# Patient Record
Sex: Female | Born: 1970 | Race: White | Hispanic: No | Marital: Married | Smoking: Never smoker
Health system: Southern US, Community
[De-identification: ages and names within clinical notes are randomized; demographics above are authoritative.]

## PROBLEM LIST (undated history)

## (undated) DIAGNOSIS — K219 Gastro-esophageal reflux disease without esophagitis: Secondary | ICD-10-CM

## (undated) DIAGNOSIS — E785 Hyperlipidemia, unspecified: Secondary | ICD-10-CM

## (undated) DIAGNOSIS — E559 Vitamin D deficiency, unspecified: Secondary | ICD-10-CM

## (undated) DIAGNOSIS — K222 Esophageal obstruction: Secondary | ICD-10-CM

## (undated) DIAGNOSIS — Z9889 Other specified postprocedural states: Secondary | ICD-10-CM

## (undated) DIAGNOSIS — R112 Nausea with vomiting, unspecified: Secondary | ICD-10-CM

## (undated) DIAGNOSIS — K449 Diaphragmatic hernia without obstruction or gangrene: Secondary | ICD-10-CM

## (undated) DIAGNOSIS — M1990 Unspecified osteoarthritis, unspecified site: Secondary | ICD-10-CM

## (undated) HISTORY — DX: Diaphragmatic hernia without obstruction or gangrene: K44.9

## (undated) HISTORY — PX: APPENDECTOMY: SHX54

## (undated) HISTORY — DX: Vitamin D deficiency, unspecified: E55.9

## (undated) HISTORY — PX: ABDOMINAL HYSTERECTOMY: SHX81

## (undated) HISTORY — PX: EXPLORATORY LAPAROTOMY: SUR591

## (undated) HISTORY — PX: CHOLECYSTECTOMY: SHX55

## (undated) HISTORY — DX: Unspecified osteoarthritis, unspecified site: M19.90

## (undated) HISTORY — DX: Hyperlipidemia, unspecified: E78.5

## (undated) HISTORY — DX: Esophageal obstruction: K22.2

## (undated) HISTORY — PX: COLONOSCOPY: SHX174

---

## 1998-02-13 ENCOUNTER — Observation Stay (HOSPITAL_COMMUNITY): Admission: AD | Admit: 1998-02-13 | Discharge: 1998-02-14 | Payer: Self-pay | Admitting: Obstetrics and Gynecology

## 1998-03-05 ENCOUNTER — Observation Stay (HOSPITAL_COMMUNITY): Admission: AD | Admit: 1998-03-05 | Discharge: 1998-03-06 | Payer: Self-pay | Admitting: Obstetrics and Gynecology

## 1998-03-20 ENCOUNTER — Other Ambulatory Visit: Admission: RE | Admit: 1998-03-20 | Discharge: 1998-03-20 | Payer: Self-pay | Admitting: Obstetrics and Gynecology

## 1998-05-08 ENCOUNTER — Encounter: Payer: Self-pay | Admitting: Obstetrics and Gynecology

## 1998-05-08 ENCOUNTER — Ambulatory Visit (HOSPITAL_COMMUNITY): Admission: RE | Admit: 1998-05-08 | Discharge: 1998-05-08 | Payer: Self-pay | Admitting: Obstetrics and Gynecology

## 1998-06-10 ENCOUNTER — Inpatient Hospital Stay (HOSPITAL_COMMUNITY): Admission: AD | Admit: 1998-06-10 | Discharge: 1998-06-10 | Payer: Self-pay | Admitting: Obstetrics and Gynecology

## 1998-06-14 ENCOUNTER — Inpatient Hospital Stay (HOSPITAL_COMMUNITY): Admission: AD | Admit: 1998-06-14 | Discharge: 1998-06-14 | Payer: Self-pay | Admitting: Obstetrics and Gynecology

## 1998-07-09 ENCOUNTER — Ambulatory Visit (HOSPITAL_COMMUNITY): Admission: RE | Admit: 1998-07-09 | Discharge: 1998-07-09 | Payer: Self-pay | Admitting: Obstetrics and Gynecology

## 1998-09-25 ENCOUNTER — Inpatient Hospital Stay (HOSPITAL_COMMUNITY): Admission: AD | Admit: 1998-09-25 | Discharge: 1998-09-29 | Payer: Self-pay | Admitting: Obstetrics and Gynecology

## 1998-09-25 ENCOUNTER — Encounter (INDEPENDENT_AMBULATORY_CARE_PROVIDER_SITE_OTHER): Payer: Self-pay | Admitting: Specialist

## 2000-12-07 ENCOUNTER — Other Ambulatory Visit: Admission: RE | Admit: 2000-12-07 | Discharge: 2000-12-07 | Payer: Self-pay | Admitting: Obstetrics and Gynecology

## 2001-08-12 ENCOUNTER — Ambulatory Visit (HOSPITAL_COMMUNITY): Admission: RE | Admit: 2001-08-12 | Discharge: 2001-08-12 | Payer: Self-pay | Admitting: Obstetrics and Gynecology

## 2001-08-12 ENCOUNTER — Encounter: Payer: Self-pay | Admitting: Obstetrics and Gynecology

## 2001-09-15 ENCOUNTER — Emergency Department (HOSPITAL_COMMUNITY): Admission: EM | Admit: 2001-09-15 | Discharge: 2001-09-16 | Payer: Self-pay | Admitting: Emergency Medicine

## 2001-09-16 ENCOUNTER — Encounter: Payer: Self-pay | Admitting: Emergency Medicine

## 2002-02-10 ENCOUNTER — Other Ambulatory Visit: Admission: RE | Admit: 2002-02-10 | Discharge: 2002-02-10 | Payer: Self-pay | Admitting: Obstetrics and Gynecology

## 2002-03-07 ENCOUNTER — Inpatient Hospital Stay (HOSPITAL_COMMUNITY): Admission: RE | Admit: 2002-03-07 | Discharge: 2002-03-10 | Payer: Self-pay | Admitting: Obstetrics and Gynecology

## 2008-03-28 ENCOUNTER — Ambulatory Visit (HOSPITAL_COMMUNITY): Admission: RE | Admit: 2008-03-28 | Discharge: 2008-03-29 | Payer: Self-pay | Admitting: Obstetrics and Gynecology

## 2010-08-13 NOTE — Op Note (Signed)
[name], [name]                 ACCOUNT NO.:  [id number]   MEDICAL RECORD NO.:  [id number]          PATIENT TYPE:  AMB   LOCATION:  SDC                           FACILITY:  [facility]   PHYSICIAN:  Zenaida Niece, M.D.DATE OF BIRTH:  [birth year]   DATE OF PROCEDURE:  03/28/2008  DATE OF DISCHARGE:                               OPERATIVE REPORT   PREOPERATIVE DIAGNOSES:  Pelvic pain.   POSTOPERATIVE DIAGNOSES:  Pelvic pain and extensive pelvic adhesions.   PROCEDURE:  Laparoscopy with extensive adhesiolysis and cystoscopy.   ANESTHESIA:  General endotracheal.   FINDINGS:  The omentum was significantly adherent to the anterior  abdominal wall and the entire pelvis.   SPECIMENS:  None.   ESTIMATED BLOOD LOSS:  Minimal.   COMPLICATIONS:  None.   PROCEDURE IN DETAIL:  The patient was taken to the operating room and  placed in the dorsal supine position.  General anesthesia was induced  and she was placed in mobile stirrups.  Abdomen was then prepped and  draped in usual sterile fashion, bladder drained with a latex-free  catheter and a sponge stick placed at the vaginal cuff for manipulation.  Infraumbilical skin was then infiltrated with 0.25% Marcaine and a 0.75  cm of vertical incision was made.  The Veress needle was inserted into  the peritoneal cavity and placement confirmed by the water-drop test and  an opening pressure of 4 mmHg.  CO2 gas was insufflated to a pressure of  14 mmHg and the Veress needle was removed.  A 5 mm disposable trocar was  then introduced with direct visualization.  Once the laparoscope was  inserted, it was noted that there were omental adhesions in the midline  from the umbilicus down.  The umbilical trocar appeared to be through  some of these adhesions.  A 5 mm port was placed on the left side under  direct visualization.  I initially inspected the pelvis.  Both tubes and  ovaries were inspected on either side of this line of adhesions.  They  were slightly adherent.  I used the Harmonic scalpel ACE and started  taking down some of these adhesions from the left side.  They were  fairly thick.  I then put the scope through the left port to see if I  could take down the adhesions through the umbilicus.  This is when I  noticed that this trocar was through some adhesions.  A second 5 mm port  was placed on the left side and adhesions were taken down around the  umbilical trocar with the Harmonic scalpel ACE.  This was done very  carefully to avoid bowel which was also adherent to the anterior  abdominal wall.  Gradually, all the adhesions were taken down to the  lower pelvis.  At this point, it was difficult to tell where the bladder  was.  Tubes and ovaries were freed up as best I could.  Again, I was  unable to clearly visualize the bladder.   She was given indigo carmine IV.  Legs were elevated in stirrups.  A 70  degree cystoscope was inserted through the urethra into the bladder and  200 mL of sterile solution was instilled.  The bladder appeared normal  and both the ureteral orifices were easily identified and had good  efflux of blue-tinged urine.  The cystoscope was then removed.  I then  changed gloves.  Attention was turned back to the laparoscopy.  The  pelvis was irrigated and found to be hemostatic.  At this point, I did  not feel I could take down any further adhesions without potentially  injuring the bowel or the bladder.  I carefully inspected any bowel that  was taken down and no serosal injury was identified.  All sites were  adequately hemostatic.  All ports were removed under direct  visualization.  Gas was allowed to deflate from the abdomen.  Skin  incisions were closed with interrupted subcuticular sutures of 4-0  Vicryl followed by Dermabond.  The sponge stick was removed from the  vagina and the bladder was drained with a red Robinson catheter.  The  patient was awakened in the operating room and taken to  the recovery  room in stable condition after tolerating the procedure well.  Counts  were correct and she had PAS hose on  throughout the procedure.      Zenaida Niece, M.D.  Electronically Signed     TDM/MEDQ  D:  03/28/2008  T:  03/29/2008  Job:  [id number]

## 2010-08-16 NOTE — Discharge Summary (Signed)
Emily Johns, Emily Johns                           ACCOUNT NO.:  [id number]   MEDICAL RECORD NO.:  [id number]                   PATIENT TYPE:  INP   LOCATION:  9324                                 FACILITY:  [facility]   PHYSICIAN:  Zenaida Niece, M.D.             DATE OF BIRTH:  1971-03-11   DATE OF ADMISSION:  03/07/2002  DATE OF DISCHARGE:  03/10/2002                                 DISCHARGE SUMMARY   ADMISSION DIAGNOSES:  1. Irregular menses.  2. Dysmenorrhea.  3. Dyspareunia.   DISCHARGE DIAGNOSES:  1. Irregular menses.  2. Dysmenorrhea.  3. Dyspareunia.  4. Adenomyosis.   PROCEDURE:  Total abdominal hysterectomy.   HISTORY AND PHYSICAL:  Please see the chart for the full history and  physical.  Briefly, this is a 40 year old white female para 3-0-0-3 with  recently heavier menses with significant cramping and significant mood  swings despite birth control pills.  She also has occasional deep  dyspareunia.  She is admitted for definitive surgical therapy.   PAST OB HISTORY:  Three cesarean sections at term without complications.  She did have a tubal ligation with her last cesarean section.   PAST MEDICAL HISTORY:  Mild asthma and migraine headaches.   PHYSICAL EXAMINATION:  ABDOMEN:  Slightly obese without palpable masses with  mild bilateral lower quadrant tenderness and well healed transverse scar.  PELVIC:  She has equivocal cervical motion tenderness and a small mid plane  to anteverted uterus which is slightly tender and no adnexal masses,  although she is slightly tender on the right.   HOSPITAL COURSE:  The patient was admitted on the day of surgery and  underwent a TAH under general anesthesia.  Estimated blood loss was 100 cc.  She had normal tubes, ovaries, and appendix with evidence of prior tubal  ligation.  Uterus was slightly enlarged and slightly boggy with some  adhesions in the anterior cul-de-sac from her prior cesarean sections.  Postoperatively she  did fairly well, although she did seem to have  suboptimal pain control.  Preoperative hemoglobin 12.9, postoperative 11.0.  She did have a low grade fever on the evening of postoperative day number  one to 100.8 which was attributed to atelectasis.  With encouragement she  was able to ambulate and eventually tolerate a regular diet.  She did have  nausea and vomiting on the evening of surgery.  On postoperative day number  three her incision was healing well and her staples were removed and Steri-  Strips applied.  She was felt to be stable enough for discharge at this  time.    DISCHARGE INSTRUCTIONS:  Regular diet.  Pelvic rest and no strenuous  activity.  Follow up in approximately 10 days for an incision check.  Medications are Percocet p.r.n. pain and over-the-counter Motrin as needed.  Zenaida Niece, M.D.    TDM/MEDQ  D:  03/10/2002  T:  03/10/2002  Job:  [id number]

## 2010-08-16 NOTE — Op Note (Signed)
NAME:  Emily Johns, Emily Johns                           ACCOUNT NO.:  [id number]   MEDICAL RECORD NO.:  [id number]                   PATIENT TYPE:  INP   LOCATION:  9324                                 FACILITY:  [facility]   PHYSICIAN:  Zenaida Niece, M.D.             DATE OF BIRTH:  1970/05/10   DATE OF PROCEDURE:  03/07/2002  DATE OF DISCHARGE:                                 OPERATIVE REPORT   PREOPERATIVE DIAGNOSES:  1. Irregular menses.  2. Dysmenorrhea.  3. Dyspareunia.   POSTOPERATIVE DIAGNOSES:  1. Irregular menses.  2. Dysmenorrhea.  3. Dyspareunia.   PROCEDURE:  Total abdominal hysterectomy.   SURGEON:  Zenaida Niece, M.D.   ASSISTANT:  Huel Cote, M.D.   ANESTHESIA:  General endotracheal tube.   ESTIMATED BLOOD LOSS:  100 cc.   FINDINGS:  Normal tubes, ovaries, and appendix.  Uterus was slightly  enlarged and slightly boggy with some adhesions in the anterior cul-de-sac  from prior cesarean sections.  There was also evidence of prior tubal  ligation.   PROCEDURE IN DETAIL:  The patient was taken to the operating room and placed  in the dorsal supine position.  General anesthesia was induced and her  abdomen and vagina were prepped and draped in the usual sterile fashion and  a Foley catheter inserted.  Her abdomen was then entered via her previous  Pfannenstiel incision.  A self retaining retractor was placed and the bowel  was packed out of the pelvis.  Both uterine cornu were grasped with long  Kelly clamps.  Both round ligaments were taken down with electrocautery and  the anterior broad ligament taken down across the anterior portion of the  uterus to create the bladder flap.  A window was made in an avascular  portion of the posterior leaf of the broad ligament and both utero-ovarian  pedicles were clamped, transected, and doubly ligated with 0 Vicryl and  number one chromic.  The pedicles were hemostatic.  Uterine arteries were  skeletonized and the  bladder was pushed inferiorly sharply and bluntly.  Uterine arteries, cardinal ligaments, and uterosacral ligaments were  clamped, transected, and ligated on each side with number 1 chromic.  Uterosacral ligaments were tagged for later use.  The vaginal angles were  then clamped with Zeppelin clamps, transected, and the uterus and cervix  were removed.  Both uterine angles were sutured with number 1 chromic and  tagged for later use.  The remainder of the vagina was closed with  interrupted figure-of-eight sutures of number 1 chromic with adequate  hemostasis.  Bleeding from the lower portion of the vagina between the cuff  and the bladder was controlled with electrocautery.  The pelvis was  irrigated and found to be hemostatic.  Uterosacral ligaments were plicated  in the midline with 2-0 silk.  The previously tagged uterosacral pedicles  were also tied in the midline.  The  pelvis was again irrigated and small  bleeders from around the bladder edge were controlled with electrocautery.  Both ureters were identified and found to be out of the operative field.  The bowel packs were removed and the self retaining retractor was removed.  The subfascial space was irrigated and made hemostatic with electrocautery.  The fascia was closed in a running fashion starting at both ends and meeting  in the middle with 0 Vicryl.  The subcutaneous tissue was irrigated and made  hemostatic with electrocautery.  Subcutaneous tissue was then closed with  running 2-0 plain gut suture and skin was closed with staples and a sterile  dressing.  The patient tolerated the procedure well and was taken to the  recovery room in stable condition.  Counts were correct x2 and she was given  Ancef 1 g prior to the procedure.                                               Zenaida Niece, M.D.    TDM/MEDQ  D:  03/07/2002  T:  03/07/2002  Job:  [id number]

## 2010-08-16 NOTE — H&P (Signed)
NAME:  Emily Johns, Emily Johns                           ACCOUNT NO.:  [id number]   MEDICAL RECORD NO.:  [id number]                   PATIENT TYPE:  INP   LOCATION:  NA                                   FACILITY:  [facility]   PHYSICIAN:  Zenaida Niece, M.D.             DATE OF BIRTH:  22-Sep-1970   DATE OF ADMISSION:  03/07/2002  DATE OF DISCHARGE:                                HISTORY & PHYSICAL   CHIEF COMPLAINT:  Abnormal uterine bleeding and dysmenorrhea.   HISTORY OF PRESENT ILLNESS:  The patient is a 40 year old white female para  3-0-0-3 whom I last saw for an annual exam on February 10, 2002.  She had  been on birth control pills since a previous telephone conversation in  September at which point she complained of menses becoming heavier with  significant cramping and significant mood swings.  She was on Ortho Tri-  Cyclen Lo and noted no improvement in her bleeding or cramping.  She also  complains of occasional deep dyspareunia.  Review of systems was otherwise  negative.  Physical exam revealed some bilateral lower quadrant tenderness  and a slightly tender uterus and slightly tender right adnexa.  Options were  discussed with the patient and she is done with childbearing and wishes to  proceed with definitive surgical therapy.   PAST OBSTETRICAL HISTORY:  Three cesarean sections at term without  complications.  With her last cesarean section in June 2000, she had a tubal  ligation as well.   PAST MEDICAL HISTORY:  History of mild asthma and a history of migraine  headaches.   PAST SURGICAL HISTORY:  Three cesarean sections with tubal ligation, removal  of a pilonidal cyst, cholecystectomy, wisdom tooth extraction, and urethral  dilation as a child.   ALLERGIES:  None known.   CURRENT MEDICATION:  Ortho Tri-Cyclen Lo.   SOCIAL HISTORY:  The patient is married and denies alcohol, tobacco, or drug  use.   FAMILY HISTORY:  No GYN or colon cancer.   REVIEW OF SYSTEMS:   Normal bowel and bladder function.   PHYSICAL EXAMINATION:  VITAL SIGNS:  Weight is 220 pounds, blood pressure is  130/80, pulse is 80.  GENERAL:  The patient is a slightly obese white female who is in no acute  distress.  NECK:  Supple without lymphadenopathy or thyromegaly.  LUNGS:  Clear to auscultation.  HEART:  Regular rate and rhythm without murmurs.  BREAST:  Breast exam in the sitting and supine position reveals no dominant  masses, adenopathy, skin change, or nipple discharge.  ABDOMEN:  Slightly obese without palpable masses and she does have mild  bilateral lower quadrant tenderness with a well-healed transverse scar.  EXTREMITIES:  No edema, are nontender, and DTRs are 2/4 and symmetric.  PELVIC:  External genitalia reveals no lesions.  On speculum exam she has a  normal-appearing cervix, no lesions, and Pap smear  is normal.  On bimanual  exam she has equivocal cervical motion tenderness and a small mid-plantar to  anteverted uterus which is slightly tender.  She has no adnexal masses but  is slightly tender on the right.   ASSESSMENT:  Persistent irregular menses with significant dysmenorrhea and  dyspareunia.  All medical and surgical options have been discussed with the  patient.  She is done with childbearing as witnessed by her previous tubal  ligation.  The patient wishes to undergo definitive surgical therapy with  hysterectomy.  Risks of surgery including bleeding, infection, damage to  surrounding organs have been discussed with the patient as well as permanent  sterility and she wishes to proceed.   PLAN:  Plan is to admit the patient on the day of surgery for a total  abdominal hysterectomy.  We will leave her ovaries unless they appear  abnormal.                                               Zenaida Niece, M.D.    TDM/MEDQ  D:  03/04/2002  T:  03/04/2002  Job:  [id number]

## 2010-11-13 ENCOUNTER — Ambulatory Visit (HOSPITAL_COMMUNITY)
Admission: EM | Admit: 2010-11-13 | Discharge: 2010-11-15 | Disposition: A | Discharge status: Home / Self Care | Payer: Federal, State, Local not specified - PPO | Source: Ambulatory Visit | Attending: Surgery | Admitting: Surgery

## 2010-11-13 ENCOUNTER — Emergency Department (HOSPITAL_COMMUNITY): Payer: Federal, State, Local not specified - PPO

## 2010-11-13 DIAGNOSIS — J45909 Unspecified asthma, uncomplicated: Secondary | ICD-10-CM | POA: Insufficient documentation

## 2010-11-13 DIAGNOSIS — K3580 Unspecified acute appendicitis: Secondary | ICD-10-CM | POA: Insufficient documentation

## 2010-11-13 LAB — BASIC METABOLIC PANEL
Calcium: 8.9 mg/dL (ref 8.4–10.5)
Chloride: 104 mEq/L (ref 96–112)
Creatinine, Ser: 0.49 mg/dL — ABNORMAL LOW (ref 0.50–1.10)
GFR calc Af Amer: >60 mL/min (ref >60)
GFR calc non Af Amer: >60 mL/min (ref >60)

## 2010-11-13 LAB — CBC
MCH: 30.3 pg (ref 26.0–34.0)
MCHC: 34.1 g/dL (ref 30.0–36.0)
MCV: 88.9 fL (ref 78.0–100.0)
Platelets: 243 10*3/uL (ref 150–400)
RDW: 12.2 % (ref 11.5–15.5)
WBC: 6.1 10*3/uL (ref 4.0–10.5)

## 2010-11-13 LAB — DIFFERENTIAL
Eosinophils Absolute: 0.3 10*3/uL (ref 0.0–0.7)
Eosinophils Relative: 4 % (ref 0–5)
Lymphs Abs: 2.2 10*3/uL (ref 0.7–4.0)
Monocytes Absolute: 0.6 10*3/uL (ref 0.1–1.0)

## 2010-11-13 LAB — URINALYSIS, ROUTINE W REFLEX MICROSCOPIC
Bilirubin Urine: NEGATIVE
Glucose, UA: NEGATIVE mg/dL
Hgb urine dipstick: NEGATIVE
Ketones, ur: NEGATIVE mg/dL
Nitrite: NEGATIVE
Protein, ur: NEGATIVE mg/dL
Specific Gravity, Urine: 1.013 (ref 1.005–1.030)
Urobilinogen, UA: 1 mg/dL (ref 0.0–1.0)
pH: 6.5 (ref 5.0–8.0)

## 2010-11-13 LAB — URINE MICROSCOPIC-ADD ON

## 2010-11-14 ENCOUNTER — Other Ambulatory Visit (INDEPENDENT_AMBULATORY_CARE_PROVIDER_SITE_OTHER): Payer: Self-pay | Admitting: General Surgery

## 2010-11-14 DIAGNOSIS — K3580 Unspecified acute appendicitis: Secondary | ICD-10-CM

## 2010-11-14 MED ORDER — IOHEXOL 300 MG/ML  SOLN
100.0000 mL | Freq: Once | INTRAMUSCULAR | Status: AC | PRN
  Administered 2010-11-14: 100 mL via INTRAVENOUS

## 2010-11-19 NOTE — Op Note (Signed)
Emily Johns, NEMMERS NO.:  [id number]  MEDICAL RECORD NO.:  [id number]  LOCATION:                                 FACILITY:  PHYSICIAN:  Cherylynn Ridges, M.D.    DATE OF BIRTH:  [birth year]  DATE OF PROCEDURE:  11/14/2010 DATE OF DISCHARGE:                              OPERATIVE REPORT   PREOPERATIVE DIAGNOSIS:  Acute appendicitis.  POSTOPERATIVE DIAGNOSIS:  Thickened and large appendix with possible early acute appendicitis.  PROCEDURE:  Laparoscopic appendectomy.  SURGEON:  Cherylynn Ridges, MD  ANESTHESIA:  General endotracheal.  ESTIMATED BLOOD LOSS:  Less than 50 mL.  COMPLICATIONS:  None.  CONDITION:  Stable.  FINDINGS:  Adhesions into the pelvic cavity, a fat appendix with possible early acute appendicitis.  No perforation or abscess.  INDICATIONS FOR OPERATION:  The patient is a 40 year old with severe right lower quadrant pain and tenderness, a fat thickened appendix on CT scan, a normal white count and diagnostic of acute appendicitis by CT findings.  OPERATION:  After proper time-out was performed identifying the patient and procedure will be done.  She was intubated and sedated in usual sterile manner and prepped and draped in usual sterile manner.  An infraumbilical midline incision was made using #15 blade, taken down to the midline fascia.  We grabbed the fascial edges with Kocher clamps and incised between that with a 15 blade.  We bluntly dissected down into the peritoneal cavity.  Once we did so, a pursestring suture of 0 Vicryl was passed around the fascial opening, then we passed a Hasson cannula into the peritoneal cavity securing it in place with a pursestring suture.  We then passed right upper quadrant 5-mm cannula and a left lower quadrant 12-mm cannula under direct vision.  The most difficult to pass was the left lower quadrant one as there were some adhesions of bowel and other structures to the anterior abdominal  wall. A care was taken to manipulate the cannula between the spaces of the peritoneal cavity and no injury to bowel was noted.  We were able to observe the enlarging fat and appendix in the left paracolic area.  It was not retrocecal.  We dissected out at its base using a [place].  We came across the base using a 3.5-mm blue cartridge Endo-GIA and then two cartridges of the 2.5-mm white vascular Endo-GIA was passed across the mesoappendix.  There was minimal bleeding.  We retrieved the appendix from the left lower quadrant site using an EndoCatch bag.  We irrigated with saline solution, aspirated all fluid and gas from above the liver and around the liver, then removed all cannulas.  We inspected the left lower quadrant site to make sure there was no injury to bowel again and none was noted.  We tied off the fascia at the infraumbilical site using a pursestring suture which was in place.  The right upper quadrant cannula was removed.  We injected 0.25% Marcaine with epi at all sites.  We then closed the left lower quadrant and the infraumbilical skin sites using running subcuticular stitch of 4-0 Monocryl.  Dermabond, Steri-Strips, and Tegaderm were used to  complete all other dressings.  All needle counts, sponge counts, and instrument counts were correct.     Cherylynn Ridges, M.D.     JOW/MEDQ  D:  11/14/2010  T:  11/14/2010  Job:  [id number]  Electronically Signed by Jimmye Norman M.D. on 11/19/2010 12:04:25 AM

## 2010-11-26 ENCOUNTER — Encounter (INDEPENDENT_AMBULATORY_CARE_PROVIDER_SITE_OTHER): Payer: Self-pay | Admitting: Radiology

## 2010-11-26 ENCOUNTER — Ambulatory Visit (INDEPENDENT_AMBULATORY_CARE_PROVIDER_SITE_OTHER): Payer: Federal, State, Local not specified - PPO | Admitting: Radiology

## 2010-11-26 DIAGNOSIS — K3580 Unspecified acute appendicitis: Secondary | ICD-10-CM

## 2010-11-26 NOTE — Progress Notes (Signed)
History of Present Illness: Emily Johns is a  40 y.o. female who presents today status post lap appy.  Pathology reveals appendicitis.  The patient is tolerating a regular diet, having normal bowel movements, has good pain control.  She  is back to most normal activities.   Physical Exam: Abd: soft, nontender, active bowel sounds, nondistended.  All incisions are well healed.  Impression: 1.  Acute appendicitis, s/p lap appy  Plan: She is able to return to normal activities. She may follow up on a prn basis.

## 2011-01-02 LAB — GLUCOSE, CAPILLARY: Glucose-Capillary: 86 mg/dL (ref 70–99)

## 2011-01-02 LAB — CBC
Platelets: 295 10*3/uL (ref 150–400)
RDW: 12.4 % (ref 11.5–15.5)
WBC: 6.4 10*3/uL (ref 4.0–10.5)

## 2012-04-28 ENCOUNTER — Encounter (HOSPITAL_COMMUNITY): Payer: Self-pay | Admitting: Pharmacist

## 2012-05-03 ENCOUNTER — Encounter (HOSPITAL_COMMUNITY)
Admission: RE | Admit: 2012-05-03 | Discharge: 2012-05-03 | Disposition: A | Discharge status: Home / Self Care | Payer: Federal, State, Local not specified - PPO | Source: Ambulatory Visit | Attending: Obstetrics and Gynecology | Admitting: Obstetrics and Gynecology

## 2012-05-03 ENCOUNTER — Encounter (HOSPITAL_COMMUNITY): Payer: Self-pay

## 2012-05-03 HISTORY — DX: Other specified postprocedural states: R11.2

## 2012-05-03 HISTORY — DX: Other specified postprocedural states: Z98.890

## 2012-05-03 LAB — CBC
MCH: 30.5 pg (ref 26.0–34.0)
MCHC: 32.9 g/dL (ref 30.0–36.0)
MCV: 92.6 fL (ref 78.0–100.0)
Platelets: 267 10*3/uL (ref 150–400)

## 2012-05-03 LAB — SURGICAL PCR SCREEN: MRSA, PCR: NEGATIVE

## 2012-05-03 NOTE — Patient Instructions (Addendum)
Your procedure is scheduled on:05/06/12  Enter through the Main Entrance at :6am Pick up desk phone and dial [phone] and inform us of your arrival.  Please call [phone] if you have any problems the morning of surgery.  Remember: Do not eat or drink after midnight:WED   Take these meds the morning of surgery with a sip of water:none  DO NOT wear jewelry, eye make-up, lipstick,body lotion, or dark fingernail polish. Do not shave for 48 hours prior to surgery.  If you are to be admitted after surgery, leave suitcase in car until your room has been assigned. Patients discharged on the day of surgery will not be allowed to drive home.

## 2012-05-06 ENCOUNTER — Ambulatory Visit (HOSPITAL_COMMUNITY)
Admission: RE | Admit: 2012-05-06 | Discharge: 2012-05-06 | Disposition: A | Discharge status: Home / Self Care | Payer: Federal, State, Local not specified - PPO | Source: Ambulatory Visit | Attending: Obstetrics and Gynecology | Admitting: Obstetrics and Gynecology

## 2012-05-06 ENCOUNTER — Encounter (HOSPITAL_COMMUNITY): Payer: Self-pay | Admitting: *Deleted

## 2012-05-06 ENCOUNTER — Ambulatory Visit (HOSPITAL_COMMUNITY): Payer: Federal, State, Local not specified - PPO | Admitting: Anesthesiology

## 2012-05-06 ENCOUNTER — Encounter (HOSPITAL_COMMUNITY): Payer: Self-pay | Admitting: Anesthesiology

## 2012-05-06 ENCOUNTER — Encounter (HOSPITAL_COMMUNITY)
Admission: RE | Disposition: A | Discharge status: Home / Self Care | Payer: Self-pay | Source: Ambulatory Visit | Attending: Obstetrics and Gynecology

## 2012-05-06 DIAGNOSIS — Z01818 Encounter for other preprocedural examination: Secondary | ICD-10-CM | POA: Insufficient documentation

## 2012-05-06 DIAGNOSIS — Z01812 Encounter for preprocedural laboratory examination: Secondary | ICD-10-CM | POA: Insufficient documentation

## 2012-05-06 DIAGNOSIS — N736 Female pelvic peritoneal adhesions (postinfective): Secondary | ICD-10-CM | POA: Insufficient documentation

## 2012-05-06 DIAGNOSIS — N949 Unspecified condition associated with female genital organs and menstrual cycle: Secondary | ICD-10-CM | POA: Insufficient documentation

## 2012-05-06 HISTORY — PX: LAPAROSCOPY: SHX197

## 2012-05-06 HISTORY — PX: SALPINGOOPHORECTOMY: SHX82

## 2012-05-06 HISTORY — PX: LAPAROSCOPIC LYSIS OF ADHESIONS: SHX5905

## 2012-05-06 SURGERY — LAPAROSCOPY OPERATIVE
Anesthesia: General | Site: Abdomen | Wound class: Clean Contaminated

## 2012-05-06 MED ORDER — MIDAZOLAM HCL 5 MG/5ML IJ SOLN
INTRAMUSCULAR | Status: DC | PRN
  Administered 2012-05-06: 2 mg via INTRAVENOUS

## 2012-05-06 MED ORDER — OXYCODONE-ACETAMINOPHEN 5-325 MG PO TABS: 1.0000 | ORAL_TABLET | ORAL | Status: DC | PRN

## 2012-05-06 MED ORDER — ONDANSETRON HCL 4 MG/2ML IJ SOLN
INTRAMUSCULAR | Status: AC
  Filled 2012-05-06: qty 2

## 2012-05-06 MED ORDER — DEXAMETHASONE SODIUM PHOSPHATE 10 MG/ML IJ SOLN
INTRAMUSCULAR | Status: AC
  Filled 2012-05-06: qty 1

## 2012-05-06 MED ORDER — DEXAMETHASONE SODIUM PHOSPHATE 4 MG/ML IJ SOLN
INTRAMUSCULAR | Status: DC | PRN
  Administered 2012-05-06: 4 mg via INTRAVENOUS
  Administered 2012-05-06: 10 mg via INTRAVENOUS

## 2012-05-06 MED ORDER — LIDOCAINE HCL (CARDIAC) 20 MG/ML IV SOLN
INTRAVENOUS | Status: AC
  Filled 2012-05-06: qty 5

## 2012-05-06 MED ORDER — LACTATED RINGERS IR SOLN
Status: DC | PRN
  Administered 2012-05-06: 3000 mL

## 2012-05-06 MED ORDER — BUPIVACAINE HCL (PF) 0.25 % IJ SOLN
INTRAMUSCULAR | Status: AC
  Filled 2012-05-06: qty 30

## 2012-05-06 MED ORDER — LACTATED RINGERS IV SOLN
INTRAVENOUS | Status: DC
  Administered 2012-05-06: 08:00:00 via INTRAVENOUS
  Administered 2012-05-06: 75 mL/h via INTRAVENOUS
  Administered 2012-05-06: 11:00:00 via INTRAVENOUS

## 2012-05-06 MED ORDER — LIDOCAINE HCL (CARDIAC) 20 MG/ML IV SOLN
INTRAVENOUS | Status: DC | PRN
  Administered 2012-05-06: 80 mg via INTRAVENOUS

## 2012-05-06 MED ORDER — FENTANYL CITRATE 0.05 MG/ML IJ SOLN
INTRAMUSCULAR | Status: AC
  Administered 2012-05-06: 50 ug via INTRAVENOUS
  Filled 2012-05-06: qty 2

## 2012-05-06 MED ORDER — PROPOFOL 10 MG/ML IV EMUL
INTRAVENOUS | Status: DC | PRN
  Administered 2012-05-06: 200 mg via INTRAVENOUS

## 2012-05-06 MED ORDER — PROMETHAZINE HCL 25 MG/ML IJ SOLN: 6.2500 mg | INTRAMUSCULAR | Status: DC | PRN

## 2012-05-06 MED ORDER — FENTANYL CITRATE 0.05 MG/ML IJ SOLN
INTRAMUSCULAR | Status: DC | PRN
  Administered 2012-05-06: 50 ug via INTRAVENOUS
  Administered 2012-05-06 (×2): 100 ug via INTRAVENOUS

## 2012-05-06 MED ORDER — SCOPOLAMINE 1 MG/3DAYS TD PT72
MEDICATED_PATCH | TRANSDERMAL | Status: AC
  Filled 2012-05-06: qty 1

## 2012-05-06 MED ORDER — PROPOFOL 10 MG/ML IV EMUL
INTRAVENOUS | Status: AC
  Filled 2012-05-06: qty 20

## 2012-05-06 MED ORDER — MEPERIDINE HCL 25 MG/ML IJ SOLN: 6.2500 mg | INTRAMUSCULAR | Status: DC | PRN

## 2012-05-06 MED ORDER — ACETAMINOPHEN 10 MG/ML IV SOLN
INTRAVENOUS | Status: DC | PRN
  Administered 2012-05-06: 1000 mg via INTRAVENOUS

## 2012-05-06 MED ORDER — METOCLOPRAMIDE HCL 5 MG/ML IJ SOLN
INTRAMUSCULAR | Status: AC
  Administered 2012-05-06: 10 mg via INTRAVENOUS
  Filled 2012-05-06: qty 2

## 2012-05-06 MED ORDER — MIDAZOLAM HCL 2 MG/2ML IJ SOLN: 0.5000 mg | Freq: Once | INTRAMUSCULAR | Status: DC | PRN

## 2012-05-06 MED ORDER — ROCURONIUM BROMIDE 100 MG/10ML IV SOLN
INTRAVENOUS | Status: DC | PRN
  Administered 2012-05-06: 5 mg via INTRAVENOUS
  Administered 2012-05-06: 40 mg via INTRAVENOUS

## 2012-05-06 MED ORDER — HYDROMORPHONE HCL PF 1 MG/ML IJ SOLN
INTRAMUSCULAR | Status: AC
  Filled 2012-05-06: qty 1

## 2012-05-06 MED ORDER — NEOSTIGMINE METHYLSULFATE 1 MG/ML IJ SOLN
INTRAMUSCULAR | Status: AC
  Filled 2012-05-06: qty 1

## 2012-05-06 MED ORDER — BUPIVACAINE HCL (PF) 0.25 % IJ SOLN
INTRAMUSCULAR | Status: DC | PRN
  Administered 2012-05-06: 14 mL

## 2012-05-06 MED ORDER — METOCLOPRAMIDE HCL 5 MG/ML IJ SOLN
10.0000 mg | Freq: Once | INTRAMUSCULAR | Status: AC
  Administered 2012-05-06: 10 mg via INTRAVENOUS

## 2012-05-06 MED ORDER — SCOPOLAMINE 1 MG/3DAYS TD PT72
1.0000 | MEDICATED_PATCH | TRANSDERMAL | Status: DC
  Administered 2012-05-06: 1.5 mg via TRANSDERMAL

## 2012-05-06 MED ORDER — KETOROLAC TROMETHAMINE 30 MG/ML IJ SOLN: 15.0000 mg | Freq: Once | INTRAMUSCULAR | Status: DC | PRN

## 2012-05-06 MED ORDER — NEOSTIGMINE METHYLSULFATE 1 MG/ML IJ SOLN
INTRAMUSCULAR | Status: DC | PRN
  Administered 2012-05-06: 3 mg via INTRAVENOUS

## 2012-05-06 MED ORDER — ACETAMINOPHEN 10 MG/ML IV SOLN
INTRAVENOUS | Status: AC
  Filled 2012-05-06: qty 100

## 2012-05-06 MED ORDER — GLYCOPYRROLATE 0.2 MG/ML IJ SOLN
INTRAMUSCULAR | Status: DC | PRN
  Administered 2012-05-06: 0.4 mg via INTRAVENOUS

## 2012-05-06 MED ORDER — KETOROLAC TROMETHAMINE 30 MG/ML IJ SOLN
INTRAMUSCULAR | Status: DC | PRN
  Administered 2012-05-06: 30 mg via INTRAVENOUS

## 2012-05-06 MED ORDER — GLYCOPYRROLATE 0.2 MG/ML IJ SOLN
INTRAMUSCULAR | Status: AC
  Filled 2012-05-06: qty 2

## 2012-05-06 MED ORDER — HYDROMORPHONE HCL PF 1 MG/ML IJ SOLN
INTRAMUSCULAR | Status: DC | PRN
  Administered 2012-05-06: 1 mg via INTRAVENOUS

## 2012-05-06 MED ORDER — ROCURONIUM BROMIDE 50 MG/5ML IV SOLN
INTRAVENOUS | Status: AC
  Filled 2012-05-06: qty 1

## 2012-05-06 MED ORDER — FENTANYL CITRATE 0.05 MG/ML IJ SOLN
25.0000 ug | INTRAMUSCULAR | Status: DC | PRN
  Administered 2012-05-06 (×2): 50 ug via INTRAVENOUS

## 2012-05-06 MED ORDER — FENTANYL CITRATE 0.05 MG/ML IJ SOLN
INTRAMUSCULAR | Status: AC
  Filled 2012-05-06: qty 5

## 2012-05-06 MED ORDER — MIDAZOLAM HCL 2 MG/2ML IJ SOLN
INTRAMUSCULAR | Status: AC
  Filled 2012-05-06: qty 2

## 2012-05-06 MED ORDER — LACTATED RINGERS IV SOLN
INTRAVENOUS | Status: DC
Start: 2012-05-06 — End: 2012-05-06

## 2012-05-06 SURGICAL SUPPLY — 33 items
CABLE HIGH FREQUENCY MONO STRZ (ELECTRODE) IMPLANT
CATH ROBINSON RED A/P 16FR (CATHETERS) ×3 IMPLANT
CHLORAPREP W/TINT 26ML (MISCELLANEOUS) ×3 IMPLANT
CLOTH BEACON ORANGE TIMEOUT ST (SAFETY) ×3 IMPLANT
DECANTER SPIKE VIAL GLASS SM (MISCELLANEOUS) ×3 IMPLANT
DERMABOND ADVANCED (GAUZE/BANDAGES/DRESSINGS) ×1
DERMABOND ADVANCED .7 DNX12 (GAUZE/BANDAGES/DRESSINGS) ×2 IMPLANT
GLOVE BIO SURGEON STRL SZ8 (GLOVE) ×3 IMPLANT
GLOVE BIOGEL PI IND STRL 6.5 (GLOVE) ×4 IMPLANT
GLOVE BIOGEL PI INDICATOR 6.5 (GLOVE) ×2
GLOVE ECLIPSE 6.0 STRL STRAW (GLOVE) ×6 IMPLANT
GLOVE ORTHO TXT STRL SZ7.5 (GLOVE) ×3 IMPLANT
GOWN PREVENTION PLUS LG XLONG (DISPOSABLE) IMPLANT
GOWN STRL REIN XL XLG (GOWN DISPOSABLE) ×9 IMPLANT
NEEDLE EPID 17G 5 ECHO TUOHY (NEEDLE) IMPLANT
NEEDLE INSUFFLATION 120MM (ENDOMECHANICALS) IMPLANT
NS IRRIG 1000ML POUR BTL (IV SOLUTION) ×3 IMPLANT
PACK LAPAROSCOPY BASIN (CUSTOM PROCEDURE TRAY) ×3 IMPLANT
POUCH SPECIMEN RETRIEVAL 10MM (ENDOMECHANICALS) ×3 IMPLANT
PROTECTOR NERVE ULNAR (MISCELLANEOUS) ×6 IMPLANT
SCALPEL HARMONIC ACE (MISCELLANEOUS) ×3 IMPLANT
SET IRRIG TUBING LAPAROSCOPIC (IRRIGATION / IRRIGATOR) ×3 IMPLANT
SLEEVE Z-THREAD 5X100MM (TROCAR) ×3 IMPLANT
SOLUTION ELECTROLUBE (MISCELLANEOUS) IMPLANT
SUT VICRYL 0 UR6 27IN ABS (SUTURE) ×3 IMPLANT
SUT VICRYL 4-0 PS2 18IN ABS (SUTURE) ×3 IMPLANT
TOWEL OR 17X24 6PK STRL BLUE (TOWEL DISPOSABLE) ×6 IMPLANT
TRAY FOLEY CATH 14FR (SET/KITS/TRAYS/PACK) IMPLANT
TROCAR BALLN 12MMX100 BLUNT (TROCAR) ×3 IMPLANT
TROCAR XCEL NON-BLD 11X100MML (ENDOMECHANICALS) IMPLANT
TROCAR XCEL NON-BLD 5MMX100MML (ENDOMECHANICALS) ×3 IMPLANT
WARMER LAPAROSCOPE (MISCELLANEOUS) ×3 IMPLANT
WATER STERILE IRR 1000ML POUR (IV SOLUTION) IMPLANT

## 2012-05-06 NOTE — H&P (Signed)
Emily Johns is an 42 y.o. female. She had her annual exam a month ago, c/o increasing pelvic pain.  She has previously had 3 c-sections, TAH in 2003 for AUB and dysmenorrhea, laparoscopy in 2009 with extensive adhesiolysis.  Current pain feels the same as before her laparoscopy.  Pelvic ultrasound with normal ovaries.  She wishes to proceed with laparoscopy to rvaluate and treat possible adhesions again.  Pertinent Gynecological History: OB History: G3, P3003 c-section x 3   Menstrual History: No LMP recorded. Patient has had a hysterectomy.    Past Medical History  Diagnosis Date  . PONV (postoperative nausea and vomiting)   . Asthma   Migraines  Past Surgical History  Procedure Date  . Cesarean section   . Abdominal hysterectomy     partial  . Appendectomy   . Cholecystectomy   . Exploratory laparotomy   Laparoscopy with LOA  History reviewed. No pertinent family history.  Social History:  reports that she has never smoked. She has never used smokeless tobacco. She reports that she does not drink alcohol or use illicit drugs.  Allergies:  Allergies  Allergen Reactions  . Shellfish Allergy Shortness Of Breath    Selling, throat closes  . Penicillins Swelling and Rash    Prescriptions prior to admission  Medication Sig Dispense Refill  . naproxen sodium (ANAPROX) 220 MG tablet Take 220 mg by mouth 2 (two) times daily as needed. For pain      . OVER THE COUNTER MEDICATION Take 2 tablets by mouth at bedtime. SnoreStop - contains belladonna        Review of Systems  Respiratory: Negative.   Cardiovascular: Negative.   Gastrointestinal: Negative.   Genitourinary: Negative.     Blood pressure 124/79, pulse 78, temperature 98.2 F (36.8 C), temperature source Oral, resp. rate 18, height 5\' 4"  (1.626 m), SpO2 100.00%. Physical Exam  Constitutional: She appears well-developed and well-nourished.  Neck: Neck supple. No thyromegaly present.  Cardiovascular: Normal  rate, regular rhythm and normal heart sounds.   No murmur heard. Respiratory: Effort normal and breath sounds normal. No respiratory distress.  GI: Soft. She exhibits no distension and no mass. There is tenderness (tender in bilateral lower quadrants).       Transverse scar  Genitourinary: Vagina normal.       Bilateral adnexal tenderness, no palpable mass    No results found for this or any previous visit (from the past 24 hour(s)).  No results found.  Assessment/Plan: Recurrent pelvic pain with h/o pelvic adhesions.  Medical and surgical options have been discussed, she feels her pain is significant enough that she would like surgery again.  Laparoscopy procedure, risks, chances of relieving her pain have all been discussed.  Will admit for laparoscopy  With probable adhesiolysis, will leave ovaries unless they are significantly involved with pathology.  Krishiv Sandler D 05/06/2012, 6:56 AM

## 2012-05-06 NOTE — OR Nursing (Signed)
Vomited in phase 2 , states she feels some better. States ready to try to go home. Family at side.

## 2012-05-06 NOTE — Op Note (Signed)
Preoperative diagnosis: Pelvic pain Postop diagnosis: Pelvic pain, pelvic adhesions Procedure: Open laparoscopy with extensive adhesiolysis and left salpingo-oophorectomy Surgeon: Lavina Hamman M.D. Anesthesia: Gen. Endotracheal tube Findings: She had extensive adhesions of the omentum to the low anterior abdominal wall. There were significant adhesions around the left tube and ovary as well. There were some adhesions to the inferior portion of the right tube and ovary as well. Complications: None Estimated blood loss: Minimal Specimens: Left tube and ovary  Procedure in detail:  The patient was taken to the operating room and placed in the dorsosupine position. General anesthesia was induced. Her left arm was tucked to her side. Legs were placed in mobile stirrups. Abdomen and perineum were prepped and draped in usual sterile fashion and bladder drained with a red Robinson catheter. Infraumbilical skin was infiltrated with quarter percent Marcaine and a 3 cm horizontal incision was made. Dissection was carried out down to the fascia which was grasped and elevated. This was then incised with scissors. The posterior sheath was then elevated and also entered sharply and the peritoneal cavity with cavity was entered bluntly. A pursestring suture of 0 Vicryl was placed around this incision and a Hassan cannula was inserted. The abdomen was insufflated and the laparoscope confirmed adequate placement. Two 5 mm ports were eventually placed on the left side for manipulation. Inspection revealed the above-mentioned findings. Using the Harmonic Scalpel Ace I was able to free up a fair amount of the adhesions to the anterior abdominal wall. However when I got too inferior I was not able to tell where the bladder would start/stop. The adhesions around the right tube and ovary were removed and the right tube and ovary were then freed. The left tube and ovary were significantly adherent. Due to this and her  persistent pain I elected to remove the left tube and ovary. The ovary was grasped and elevated and Harmonic Scalpel was used to carefully dissected around it and and come through the infundibulopelvic ligament and free the ovary from all its surroundings. There was no apparent injury to the bowel or pelvic sidewall. This area was then irrigated and found to be hemostatic. At this point again I did not feel any further dissection of the adhesions were going to be useful as I was concerned that I would enter the bladder. The 10 mm scope was removed through the umbilicus. A 5 mm scope was placed and into one of the trocars on the left side. An Endobag was placed and the left tube and ovary were scooped into the Endobag. The 5 mm ports were removed. The left tube and ovary then removed as the Hassan cannula was removed through the umbilical incision. The pursestring suture was tied. Skin incisions were closed with interrupted subcuticular sutures of 4-0 Vicryl followed by Dermabond. The patient tolerated the procedure well and was taken to the recovery room in stable condition. All counts were correct and she had PAS hose on throughout the procedure.

## 2012-05-06 NOTE — Interval H&P Note (Signed)
History and Physical Interval Note:  05/06/2012 7:08 AM  Emily Johns  has presented today for surgery, with the diagnosis of pelvic pain   The various methods of treatment have been discussed with the patient and family. After consideration of risks, benefits and other options for treatment, the patient has consented to  Procedure(s) (LRB) with comments: LAPAROSCOPY OPERATIVE (N/A) - OPEN DX LAPAROSCOPY as a surgical intervention .  The patient's history has been reviewed, patient examined, no change in status, stable for surgery.  I have reviewed the patient's chart and labs.  Questions were answered to the patient's satisfaction.     Nasario Czerniak D

## 2012-05-06 NOTE — Anesthesia Preprocedure Evaluation (Signed)
Anesthesia Evaluation  Patient identified by MRN, date of birth, ID band Patient awake    Reviewed: Allergy & Precautions, H&P , Patient's Chart, lab work & pertinent test results, reviewed documented beta blocker date and time   History of Anesthesia Complications (+) PONV  Airway Mallampati: III TM Distance: >3 FB Neck ROM: full    Dental No notable dental hx.    Pulmonary neg pulmonary ROS, asthma ,  breath sounds clear to auscultation  Pulmonary exam normal       Cardiovascular Exercise Tolerance: Good negative cardio ROS  Rhythm:regular Rate:Normal     Neuro/Psych negative neurological ROS  negative psych ROS   GI/Hepatic negative GI ROS, Neg liver ROS,   Endo/Other  negative endocrine ROSMorbid obesity  Renal/GU negative Renal ROS     Musculoskeletal   Abdominal   Peds  Hematology negative hematology ROS (+)   Anesthesia Other Findings   Reproductive/Obstetrics negative OB ROS                           Anesthesia Physical Anesthesia Plan  ASA: III  Anesthesia Plan: General ETT   Post-op Pain Management:    Induction:   Airway Management Planned:   Additional Equipment:   Intra-op Plan:   Post-operative Plan:   Informed Consent: I have reviewed the patients History and Physical, chart, labs and discussed the procedure including the risks, benefits and alternatives for the proposed anesthesia with the patient or authorized representative who has indicated his/her understanding and acceptance.   Dental Advisory Given  Plan Discussed with: CRNA and Surgeon  Anesthesia Plan Comments:         Anesthesia Quick Evaluation

## 2012-05-06 NOTE — Transfer of Care (Signed)
Immediate Anesthesia Transfer of Care Note  Patient: Larey Dresser  Procedure(s) Performed: Procedure(s) (LRB) with comments: LAPAROSCOPY OPERATIVE (N/A) LAPAROSCOPIC LYSIS OF ADHESIONS (N/A) SALPINGO OOPHORECTOMY (Left)  Patient Location: PACU  Anesthesia Type:General  Level of Consciousness: awake, alert  and oriented  Airway & Oxygen Therapy: Patient Spontanous Breathing and Patient connected to nasal cannula oxygen  Post-op Assessment: Report given to PACU RN and Post -op Vital signs reviewed and stable  Post vital signs: stable  Complications: No apparent anesthesia complications

## 2012-05-06 NOTE — Anesthesia Postprocedure Evaluation (Signed)
  Anesthesia Post-op Note  Patient: Emily Johns  Procedure(s) Performed: Procedure(s) (LRB) with comments: LAPAROSCOPY OPERATIVE (N/A) LAPAROSCOPIC LYSIS OF ADHESIONS (N/A) SALPINGO OOPHORECTOMY (Left)  Patient Location: PACU  Anesthesia Type:General  Level of Consciousness: awake, alert  and oriented  Airway and Oxygen Therapy: Patient Spontanous Breathing  Post-op Pain: mild  Post-op Assessment: Post-op Vital signs reviewed, Patient's Cardiovascular Status Stable, Respiratory Function Stable, Patent Airway, No signs of Nausea or vomiting and Pain level controlled  Post-op Vital Signs: Reviewed and stable  Complications: No apparent anesthesia complications

## 2012-05-07 ENCOUNTER — Encounter (HOSPITAL_COMMUNITY): Payer: Self-pay | Admitting: Obstetrics and Gynecology

## 2012-06-26 ENCOUNTER — Encounter: Payer: Self-pay | Admitting: Physician Assistant

## 2012-06-26 ENCOUNTER — Ambulatory Visit (INDEPENDENT_AMBULATORY_CARE_PROVIDER_SITE_OTHER): Payer: Federal, State, Local not specified - PPO | Admitting: Physician Assistant

## 2012-06-26 VITALS — BP 120/81 | HR 76 | Temp 98.7°F | Ht 64.0 in | Wt 246.0 lb

## 2012-06-26 DIAGNOSIS — J209 Acute bronchitis, unspecified: Secondary | ICD-10-CM

## 2012-06-26 DIAGNOSIS — R05 Cough: Secondary | ICD-10-CM

## 2012-06-26 DIAGNOSIS — R059 Cough, unspecified: Secondary | ICD-10-CM

## 2012-06-26 MED ORDER — HYDROCODONE-HOMATROPINE 5-1.5 MG/5ML PO SYRP: 5.0000 mL | ORAL_SOLUTION | Freq: Three times a day (TID) | ORAL | Status: DC | PRN

## 2012-06-26 MED ORDER — CLARITHROMYCIN 500 MG PO TABS
500.0000 mg | ORAL_TABLET | Freq: Two times a day (BID) | ORAL | Status: DC
Start: 2012-06-26 — End: 2013-02-10

## 2012-06-28 NOTE — Progress Notes (Signed)
  Subjective:    Patient ID: Emily Johns, female    DOB: 1971-01-14, 42 y.o.   MRN: [id number]  HPI Sinus pain and pressure for the past 3 days   Review of Systems  HENT: Positive for congestion, rhinorrhea, sneezing, postnasal drip and sinus pressure. Negative for ear pain, nosebleeds and tinnitus.   All other systems reviewed and are negative.       Objective:   Physical Exam  Vitals reviewed. Constitutional: She is oriented to person, place, and time. She appears well-developed and well-nourished.  HENT:  Head: Atraumatic.  Right Ear: External ear normal.  Left Ear: External ear normal.  Mouth/Throat: Oropharynx is clear and moist.  maxofacial tenderness Nasal hypertrophy 2+ pink tonsils  Eyes: Conjunctivae and EOM are normal. Pupils are equal, round, and reactive to light.  Neck: Normal range of motion. Neck supple.  Cardiovascular: Normal rate, regular rhythm and normal heart sounds.   Pulmonary/Chest: Effort normal.  Dry cough Expiratory crackles  Neurological: She is alert and oriented to person, place, and time.  Psychiatric: She has a normal mood and affect. Her behavior is normal. Judgment and thought content normal.          Assessment & Plan:  Cough - Plan: HYDROcodone-homatropine (HYCODAN) 5-1.5 MG/5ML syrup  Acute bronchitis - Plan: clarithromycin (BIAXIN) 500 MG tablet

## 2012-09-06 ENCOUNTER — Ambulatory Visit (INDEPENDENT_AMBULATORY_CARE_PROVIDER_SITE_OTHER): Payer: Federal, State, Local not specified - PPO | Admitting: Family Medicine

## 2012-09-06 ENCOUNTER — Telehealth: Payer: Self-pay | Admitting: Nurse Practitioner

## 2012-09-06 VITALS — BP 116/81 | HR 89 | Temp 98.2°F | Wt 249.4 lb

## 2012-09-06 DIAGNOSIS — R109 Unspecified abdominal pain: Secondary | ICD-10-CM

## 2012-09-06 DIAGNOSIS — N390 Urinary tract infection, site not specified: Secondary | ICD-10-CM

## 2012-09-06 LAB — POCT URINALYSIS DIPSTICK
Bilirubin, UA: NEGATIVE
Glucose, UA: NEGATIVE
Ketones, UA: NEGATIVE
Nitrite, UA: POSITIVE
Spec Grav, UA: 1.01
Urobilinogen, UA: NEGATIVE
pH, UA: 5

## 2012-09-06 LAB — POCT UA - MICROSCOPIC ONLY
Casts, Ur, LPF, POC: NEGATIVE
Crystals, Ur, HPF, POC: NEGATIVE
Mucus, UA: NEGATIVE
Yeast, UA: NEGATIVE

## 2012-09-06 LAB — POCT CBC
Granulocyte percent: 71.4 %G (ref 37–80)
HCT, POC: 38.9 % (ref 37.7–47.9)
Hemoglobin: 13.2 g/dL (ref 12.2–16.2)
Lymph, poc: 2 (ref 0.6–3.4)
MCH, POC: 30.7 pg (ref 27–31.2)
MCHC: 33.9 g/dL (ref 31.8–35.4)
MCV: 90.5 fL (ref 80–97)
MPV: 7.3 fL (ref 0–99.8)
POC Granulocyte: 6.1 (ref 2–6.9)
POC LYMPH PERCENT: 23 %L (ref 10–50)
Platelet Count, POC: 273 10*3/uL (ref 142–424)
RBC: 4.3 M/uL (ref 4.04–5.48)
RDW, POC: 12 %
WBC: 8.5 10*3/uL (ref 4.6–10.2)

## 2012-09-06 MED ORDER — PHENAZOPYRIDINE HCL 200 MG PO TABS: 200.0000 mg | ORAL_TABLET | Freq: Three times a day (TID) | ORAL | Status: DC | PRN

## 2012-09-06 MED ORDER — CIPROFLOXACIN HCL 500 MG PO TABS
500.0000 mg | ORAL_TABLET | Freq: Two times a day (BID) | ORAL | Status: DC
Start: 2012-09-06 — End: 2013-02-10

## 2012-09-06 NOTE — Progress Notes (Signed)
Patient ID: Emily Johns, female   DOB: 08/31/1970, 42 y.o.   MRN: [id number] SUBJECTIVE:  Chief Complaint  Patient presents with  . Acute Visit    UTI    HPI: Urinary Tract Infection Patient complains of burning with urination, dysuria, frequency and inability to void. She has had symptoms for 1 week. Patient also complains of having UTI before. Patient denies back pain, cough and vaginal discharge. Patient does have a history of recurrent UTI. Patient does not have a history of pyelonephritis.    S/p appendectomy, s/p cholecystectomy,s/p hysterectomy. Symptoms of dysuria for 1 week. Then 2 nights ago the dysuria got worse and now painful to urinate and lower abdomen is  Sore.Marland Kitchen   PMH/PSH: reviewed/updated in Epic  SH/FH: reviewed/updated in Epic  Allergies: reviewed/updated in Epic  Medications: reviewed/updated in Epic  Immunizations: reviewed/updated in Epic  ROS: As above in the HPI. All other systems are stable or negative.  OBJECTIVE: APPEARANCE:  Patient in no acute distress.The patient appeared well nourished and normally developed. Acyanotic. Waist: VITAL SIGNS:BP 116/81  Pulse 89  Temp(Src) 98.2 F (36.8 C) (Oral)  Wt 249 lb 6.4 oz (113.127 kg)  BMI 42.79 kg/m2 WF  SKIN: warm and  Dry without overt rashes, tattoos and scars  HEAD and Neck: without JVD, Head and scalp: normal Eyes:No scleral icterus. Fundi normal, eye movements normal. Ears: Auricle normal, canal normal, Tympanic membranes normal, insufflation normal. Nose: normal Throat: normal Neck & thyroid: normal  CHEST & LUNGS: Chest wall: normal Lungs: Clear  CVS: Reveals the PMI to be normally located. Regular rhythm, First and Second Heart sounds are normal,  absence of murmurs, rubs or gallops. Peripheral vasculature: Radial pulses: normal Dorsal pedis pulses: normal Posterior pulses: normal  ABDOMEN:  Appearance:obese Benign, no organomegaly, no masses, no Abdominal Aortic  enlargement. No Guarding , no rebound. No Bruits. Bowel sounds: normal  RECTAL: N/A GU: N/A  EXTREMETIES: nonedematous. Both Femoral and Pedal pulses are normal.  MUSCULOSKELETAL:  Spine: normal Joints: intact  NEUROLOGIC: oriented to time,place and person; nonfocal. Strength is normal Sensory is normal Reflexes are normal Cranial Nerves are normal.  ASSESSMENT: Urinary tract infection, site not specified - Plan: POCT UA - Microscopic Only, POCT urinalysis dipstick, Urine culture, POCT CBC, ciprofloxacin (CIPRO) 500 MG tablet, phenazopyridine (PYRIDIUM) 200 MG tablet  Abdominal pain, unspecified site - Plan: POCT CBC, ciprofloxacin (CIPRO) 500 MG tablet, phenazopyridine (PYRIDIUM) 200 MG tablet   PLAN:  Orders Placed This Encounter  Procedures  . Urine culture  . POCT UA - Microscopic Only  . POCT urinalysis dipstick  . POCT CBC    Results for orders placed in visit on 09/06/12  POCT UA - MICROSCOPIC ONLY      Result Value Range   WBC, Ur, HPF, POC tntc     RBC, urine, microscopic 5-10     Bacteria, U Microscopic many     Mucus, UA negative     Epithelial cells, urine per micros few     Crystals, Ur, HPF, POC negative     Casts, Ur, LPF, POC negative     Yeast, UA negative    POCT URINALYSIS DIPSTICK      Result Value Range   Color, UA pale yellow     Clarity, UA cloudy     Glucose, UA negative     Bilirubin, UA negative     Ketones, UA negative     Spec Grav, UA 1.010     Blood, UA  large     pH, UA 5.0     Protein, UA 4+     Urobilinogen, UA negative     Nitrite, UA positive     Leukocytes, UA large (3+)    POCT CBC      Result Value Range   WBC 8.5  4.6 - 10.2 K/uL   Lymph, poc 2.0  0.6 - 3.4   POC LYMPH PERCENT 23.0  10 - 50 %L   POC Granulocyte 6.1  2 - 6.9   Granulocyte percent 71.4  37 - 80 %G   RBC 4.3  4.04 - 5.48 M/uL   Hemoglobin 13.2  12.2 - 16.2 g/dL   HCT, POC 21.3  08.6 - 47.9 %   MCV 90.5  80 - 97 fL   MCH, POC 30.7  27 - 31.2 pg    MCHC 33.9  31.8 - 35.4 g/dL   RDW, POC 57.8     Platelet Count, POC 273.0  142 - 424 K/uL   MPV 7.3  0 - 99.8 fL   Meds ordered this encounter  Medications  . ciprofloxacin (CIPRO) 500 MG tablet    Sig: Take 1 tablet (500 mg total) by mouth 2 (two) times daily.    Dispense:  20 tablet    Refill:  0  . phenazopyridine (PYRIDIUM) 200 MG tablet    Sig: Take 1 tablet (200 mg total) by mouth 3 (three) times daily as needed for pain.    Dispense:  10 tablet    Refill:  0   Return if symptoms worsen or fail to improve.  Antuane Eastridge P. [place] Charon, M.D.

## 2012-09-06 NOTE — Telephone Encounter (Signed)
appt made

## 2012-09-10 LAB — URINE CULTURE: Colony Count: >=100000

## 2012-09-10 NOTE — Progress Notes (Signed)
Quick Note:  Labs abnormal.UTI with E Coli as expected. Should clear up with the Cipro. Repeat the Urine Culture in 4 weeks to ensure clearance of the infection, since it was so severe. Licia Harl P. [place] Charon, M.D.  ______

## 2013-02-10 ENCOUNTER — Ambulatory Visit (INDEPENDENT_AMBULATORY_CARE_PROVIDER_SITE_OTHER): Payer: Federal, State, Local not specified - PPO | Admitting: Family Medicine

## 2013-02-10 ENCOUNTER — Encounter: Payer: Self-pay | Admitting: Family Medicine

## 2013-02-10 ENCOUNTER — Encounter (INDEPENDENT_AMBULATORY_CARE_PROVIDER_SITE_OTHER): Payer: Self-pay

## 2013-02-10 VITALS — BP 106/69 | HR 61 | Temp 98.5°F | Ht 65.25 in | Wt 239.0 lb

## 2013-02-10 DIAGNOSIS — H60399 Other infective otitis externa, unspecified ear: Secondary | ICD-10-CM

## 2013-02-10 DIAGNOSIS — E162 Hypoglycemia, unspecified: Secondary | ICD-10-CM

## 2013-02-10 DIAGNOSIS — J329 Chronic sinusitis, unspecified: Secondary | ICD-10-CM

## 2013-02-10 DIAGNOSIS — H6093 Unspecified otitis externa, bilateral: Secondary | ICD-10-CM

## 2013-02-10 MED ORDER — AZITHROMYCIN 250 MG PO TABS: ORAL_TABLET | ORAL | Status: DC

## 2013-02-10 MED ORDER — NEOMYCIN-POLYMYXIN-HC 3.5-10000-1 OT SOLN: 3.0000 [drp] | Freq: Four times a day (QID) | OTIC | Status: DC

## 2013-02-10 NOTE — Patient Instructions (Signed)
Patient information: Low blood sugar in people without diabetes (The Basics) View in Spanish  Written by the doctors and editors at [employer] What is low blood sugar? - Low blood sugar is a condition that happens when the level of sugar in a person's blood gets too low. Some symptoms of low blood sugar are mild, such as sweating or feeling hungry. Others are severe, such as passing out. Another word for low blood sugar is "hypoglycemia."  Low blood sugar happens most often in people with diabetes. It is uncommon in people who do not have diabetes. People without diabetes might feel like they have low blood sugar sometimes, but they most likely don't.  What causes low blood sugar in people without diabetes? - Causes of low blood sugar in people without diabetes include:  ?Certain medicines ?Drinking alcohol, especially drinking a lot over a few days ?Certain illnesses that affect the liver or kidneys ?Anorexia nervosa - This is an eating disorder that makes people lose more weight than is healthy. ?Growths or problems in the pancreas - The pancreas is an organ that makes hormones and juices that help the body break down food (figure 1). ?As a side effect of weight loss surgery ?Hormone conditions that some babies are born with What are the symptoms of low blood sugar? - Symptoms of low blood sugar can include:  ?Sweating or shaking ?Feeling hungry ?Feeling worried If low blood sugar levels are not treated, severe symptoms can occur. These can include:  ?A headache, blurry vision, or feeling dizzy ?Feeling weak or having trouble walking ?Acting confused or not thinking clearly ?Passing out Some people have symptoms within a few hours of eating a meal. Other people have symptoms when they haven't eaten for many hours.  How do I know if I have low blood sugar? - To be diagnosed with low blood sugar, you must meet certain conditions. You must:  ?Have symptoms of low blood sugar ?Have a low  blood sugar level when you have the symptoms ?Feel better after you eat something that raises your blood sugar level to normal To check if you meet these conditions, your doctor will do blood tests when you have symptoms of low blood sugar. Your doctor might have you do things to bring on your symptoms so he or she can do the tests. For example, he or she might have you stop eating for a certain amount of time.  After your doctor knows for sure that you get low blood sugar, he or she will look for the cause. To do this, he or she might order tests, including:  ?Other blood tests ?A CT scan, MRI scan, or ultrasound - These are imaging tests that can create pictures of the inside of the body. How is low blood sugar treated? - Treatment for low blood sugar involves both raising your blood sugar and treating the cause of your low blood sugar.  Your doctor or nurse will teach you how you can raise your blood sugar when it gets low. People usually raise their blood sugar by eating or drinking quick sources of sugar (table 1). Depending on your condition, your doctor might recommend that you carry a quick source of sugar with you at all times in case you need it.  If your blood sugar is so low that you are confused or passing out, and you are not able to eat anything, you will need to be treated with a glucagon shot. Glucagon is a hormone that  can quickly raise blood sugar levels and stop severe symptoms. It comes in the form of a shot. If your doctor recommends that you carry a glucagon shot with you, he or she will tell you when and how to use it (picture 1 and picture 2 and figure 2). Family members should also learn how to give you a glucagon shot. That way, a family member can give it to you if you can't do it yourself.  Your doctor will also treat the condition that's causing your low blood sugar, if it can be treated. For example, if a medicine is causing your low blood sugar, your doctor can change or  stop your medicine. If you have a growth in your pancreas, your doctor might do surgery to remove the growth.  When should I go to a hospital or call for an ambulance? - A family member or friend should take you to a hospital or call for an ambulance (in the [place] and [place], dial 9-1-1) if you:  ?Still have low blood sugar after you have had a quick source of sugar ?Are still confused 15 minutes after being treated with a glucagon shot ?Have passed out and there is no glucagon shot nearby If you have low blood sugar, do not try to drive yourself to the hospital. Driving with low blood sugar can be dangerous. Once you are in the hospital or ambulance, you will be given fluids with sugar into your vein. This treatment will raise your blood sugar level immediately.  More on this topic

## 2013-02-10 NOTE — Progress Notes (Signed)
  Subjective:    Patient ID: Emily Johns, female    DOB: September 30, 1970, 42 y.o.   MRN: [id number]  HPI SINUSITIS Onset:  2 weeks  Location: bilateral maxillary sinuses  Description:sinus pressure, teeth pain, also with bilateral ear pain and mild rhinorrhea   Modifying factors: none   Symptoms Cough:  no Discharge:  yes Fever: no Sinus Pressure:  yes Ears Blocked:  mild Teeth Ache: yes Frontal Headache:  yes Second Sickening:  no  Red Flags Change in mental state: no Change in vision: no  Pt also reports intermittent episodes of weakness and dizziness after prolonged episodes of fasting. Has had similar sxs if she eats too heavy of a meal. No formal dx of DM in the past, though with family history. Some subjective polyphagia. No polyuria, polydypsia. Would like to be checked for DM today.        Review of Systems  All other systems reviewed and are negative.       Objective:   Physical Exam  Constitutional:  Obese    HENT:  Head: Normocephalic and atraumatic.  Bilateral ear canal erythema and tenderness to otoscopic evaluation.  +nasal erythema, rhinorrhea bilaterally, + post oropharyngeal erythema  + bilateral maxillar sinus TTP    Eyes: Conjunctivae are normal. Pupils are equal, round, and reactive to light.  Neck: Normal range of motion.  Cardiovascular: Normal rate and regular rhythm.   Pulmonary/Chest: Effort normal and breath sounds normal.  Abdominal: Soft.  Musculoskeletal: Normal range of motion.  Lymphadenopathy:    She has cervical adenopathy.  Neurological: She is alert.  Skin: Skin is warm.          Assessment & Plan:  Hypoglycemia - Plan: POCT glycosylated hemoglobin (Hb A1C), POCT glucose (manual entry)  Sinusitis - Plan: azithromycin (ZITHROMAX) 250 MG tablet  OE (otitis externa), bilateral - Plan: neomycin-polymyxin-hydrocortisone (CORTISPORIN) otic solution  Wil treat with zpak and cortisporin otic  Discussed infectious and ENT  red flags at length with pt.  CBG and A1C LLN today Suspect that pt may have overly extended periods of fasting (>6 hours) between meals on a daily basis.  Recommend pt have scheduled snacks between meals and closer interval between meals.  Hypoglycemia handout given Consider endocrine workup if sxs persist despite treatment.  Follow up as needed.

## 2013-04-28 ENCOUNTER — Ambulatory Visit (INDEPENDENT_AMBULATORY_CARE_PROVIDER_SITE_OTHER): Payer: Federal, State, Local not specified - PPO | Admitting: Nurse Practitioner

## 2013-04-28 ENCOUNTER — Telehealth: Payer: Self-pay | Admitting: Nurse Practitioner

## 2013-04-28 ENCOUNTER — Encounter: Payer: Self-pay | Admitting: Nurse Practitioner

## 2013-04-28 VITALS — BP 114/79 | HR 77 | Temp 96.8°F | Ht 65.25 in | Wt 245.5 lb

## 2013-04-28 DIAGNOSIS — J019 Acute sinusitis, unspecified: Secondary | ICD-10-CM

## 2013-04-28 MED ORDER — AZITHROMYCIN 250 MG PO TABS: ORAL_TABLET | ORAL | Status: DC

## 2013-04-28 NOTE — Patient Instructions (Signed)

## 2013-04-28 NOTE — Telephone Encounter (Signed)
Pt having sinus pressure, drainage appt scheduled

## 2013-04-28 NOTE — Progress Notes (Signed)
   Subjective:    Patient ID: Emily Johns, female    DOB: Dec 15, 1970, 43 y.o.   MRN: [id number]  HPI Patient in today c/o cough congestion and headache- bil ear pain- started about 1 week ago. Mucinex OTC helps some.    Review of Systems  Constitutional: Negative for fever and chills.  HENT: Positive for congestion, ear pain, postnasal drip, rhinorrhea and sore throat.   Respiratory: Positive for cough (productive- greenish).   Cardiovascular: Negative.   Genitourinary: Negative.   All other systems reviewed and are negative.       Objective:   Physical Exam  Constitutional: She is oriented to person, place, and time. She appears well-developed and well-nourished.  HENT:  Right Ear: Hearing, tympanic membrane, external ear and ear canal normal.  Left Ear: Hearing, tympanic membrane, external ear and ear canal normal.  Nose: Mucosal edema and rhinorrhea present. Right sinus exhibits maxillary sinus tenderness and frontal sinus tenderness. Left sinus exhibits maxillary sinus tenderness and frontal sinus tenderness.  Mouth/Throat: Uvula is midline, oropharynx is clear and moist and mucous membranes are normal.  Cardiovascular: Normal rate, regular rhythm and normal heart sounds.   Pulmonary/Chest: Effort normal and breath sounds normal. No respiratory distress. She has no wheezes. She has no rales.  Dry tight cough  Neurological: She is alert and oriented to person, place, and time.  Skin: Skin is warm.  Psychiatric: She has a normal mood and affect. Her behavior is normal. Judgment and thought content normal.   BP 114/79  Pulse 77  Temp(Src) 96.8 F (36 C) (Oral)  Ht 5' 5.25" (1.657 m)  Wt 245 lb 8 oz (111.358 kg)  BMI 40.56 kg/m2        Assessment & Plan:   1. Acute sinusitis    Meds ordered this encounter  Medications  . azithromycin (ZITHROMAX Z-PAK) 250 MG tablet    Sig: As directed    Dispense:  6 each    Refill:  0    Order Specific Question:  Supervising  Provider    Answer:  Chipper Herb [[id number]]   1. Take meds as prescribed 2. Use a cool mist humidifier especially during the winter months and when heat has been humid. 3. Use saline nose sprays frequently 4. Saline irrigations of the nose can be very helpful if done frequently.  * 4X daily for 1 week*  * Use of a nettie pot can be helpful with this. Follow directions with this* 5. Drink plenty of fluids 6. Keep thermostat turn down low 7.For any cough or congestion  Use plain Mucinex- regular strength or max strength is fine   * Children- consult with Pharmacist for dosing 8. For fever or aces or pains- take tylenol or ibuprofen appropriate for age and weight.  * for fevers greater than 101 orally you may alternate ibuprofen and tylenol every  3 hours.   Mary-Margaret Hassell Done, FNP

## 2013-05-18 ENCOUNTER — Telehealth: Payer: Self-pay | Admitting: Nurse Practitioner

## 2013-05-18 ENCOUNTER — Ambulatory Visit (INDEPENDENT_AMBULATORY_CARE_PROVIDER_SITE_OTHER): Payer: Federal, State, Local not specified - PPO | Admitting: Family Medicine

## 2013-05-18 ENCOUNTER — Encounter: Payer: Self-pay | Admitting: Family Medicine

## 2013-05-18 VITALS — BP 108/64 | HR 99 | Temp 99.3°F | Ht 65.25 in | Wt 241.0 lb

## 2013-05-18 DIAGNOSIS — R059 Cough, unspecified: Secondary | ICD-10-CM

## 2013-05-18 DIAGNOSIS — R05 Cough: Secondary | ICD-10-CM

## 2013-05-18 DIAGNOSIS — J209 Acute bronchitis, unspecified: Secondary | ICD-10-CM

## 2013-05-18 LAB — POCT INFLUENZA A/B
Influenza A, POC: NEGATIVE
Influenza B, POC: NEGATIVE

## 2013-05-18 MED ORDER — HYDROCODONE-HOMATROPINE 5-1.5 MG/5ML PO SYRP: 5.0000 mL | ORAL_SOLUTION | Freq: Three times a day (TID) | ORAL | Status: DC | PRN

## 2013-05-18 MED ORDER — METHYLPREDNISOLONE (PAK) 4 MG PO TABS: ORAL_TABLET | ORAL | Status: DC

## 2013-05-18 NOTE — Telephone Encounter (Signed)
appt made

## 2013-05-18 NOTE — Progress Notes (Signed)
   Subjective:    Patient ID: Emily Johns, female    DOB: 08/17/1970, 43 y.o.   MRN: [id number]  HPI This 43 y.o. female presents for evaluation of cough, fever, face pain, and uri sx's. She has started levaquin 500mg  one po qd x 14 days which I called in for her yesterday.   Review of Systems No chest pain, SOB, HA, dizziness, vision change, N/V, diarrhea, constipation, dysuria, urinary urgency or frequency, myalgias, arthralgias or rash.     Objective:   Physical Exam  Vital signs noted  Well developed well nourished female.  HEENT - Head atraumatic Normocephalic                Eyes - PERRLA, Conjuctiva - clear Sclera- Clear EOMI                Ears - EAC's Wnl TM's Wnl Gross Hearing WNL                Nose - Nares patent                 Throat - oropharanx wnl Respiratory - Lungs CTA bilateral Cardiac - RRR S1 and S2 without murmur GI - Abdomen soft Nontender and bowel sounds active x 4 Extremities - No edema. Neuro - Grossly intact.  Results for orders placed in visit on 05/18/13  POCT INFLUENZA A/B      Result Value Ref Range   Influenza A, POC Negative     Influenza B, POC Negative        Assessment & Plan:  Cough - Plan: POCT Influenza A/B  Bronchitis - Levaquin 500mg  one po qd x 14 days, medrol dose pack as directed, hycodan  Cough syrup.  Push po fluids, rest, tylenol and motrin otc prn as directed for fever, arthralgias, and myalgias.  Follow up prn if sx's continue or persist.  Lysbeth Penner FNP

## 2013-12-08 ENCOUNTER — Telehealth: Payer: Self-pay | Admitting: Family Medicine

## 2013-12-09 NOTE — Telephone Encounter (Signed)
Appt given

## 2013-12-12 ENCOUNTER — Ambulatory Visit: Payer: Federal, State, Local not specified - PPO | Admitting: Family Medicine

## 2013-12-19 ENCOUNTER — Encounter: Payer: Self-pay | Admitting: Family Medicine

## 2013-12-19 ENCOUNTER — Ambulatory Visit (INDEPENDENT_AMBULATORY_CARE_PROVIDER_SITE_OTHER): Payer: Federal, State, Local not specified - PPO | Admitting: Family Medicine

## 2013-12-19 ENCOUNTER — Encounter (INDEPENDENT_AMBULATORY_CARE_PROVIDER_SITE_OTHER): Payer: Self-pay

## 2013-12-19 VITALS — BP 109/71 | HR 66 | Temp 98.4°F | Ht 65.25 in | Wt 245.4 lb

## 2013-12-19 DIAGNOSIS — H6990 Unspecified Eustachian tube disorder, unspecified ear: Secondary | ICD-10-CM

## 2013-12-19 DIAGNOSIS — H6980 Other specified disorders of Eustachian tube, unspecified ear: Secondary | ICD-10-CM

## 2013-12-19 DIAGNOSIS — R609 Edema, unspecified: Secondary | ICD-10-CM

## 2013-12-19 DIAGNOSIS — J069 Acute upper respiratory infection, unspecified: Secondary | ICD-10-CM

## 2013-12-19 MED ORDER — AZITHROMYCIN 250 MG PO TABS: ORAL_TABLET | ORAL | Status: DC

## 2013-12-19 MED ORDER — FUROSEMIDE 20 MG PO TABS: ORAL_TABLET | ORAL | Status: DC

## 2013-12-19 MED ORDER — METHYLPREDNISOLONE ACETATE 80 MG/ML IJ SUSP
80.0000 mg | Freq: Once | INTRAMUSCULAR | Status: AC
  Administered 2013-12-19: 80 mg via INTRAMUSCULAR

## 2013-12-19 NOTE — Progress Notes (Signed)
   Subjective:    Patient ID: Fanny Skates, female    DOB: [birth year], 43 y.o.   MRN: [id number]  HPI This 43 y.o. female presents for evaluation of uri sx's and c/o bilateral feet edema that is worsening.   Review of Systems No chest pain, SOB, HA, dizziness, vision change, N/V, diarrhea, constipation, dysuria, urinary urgency or frequency, myalgias, arthralgias or rash.     Objective:   Physical Exam Vital signs noted  Well developed well nourished female.  HEENT - Head atraumatic Normocephalic                Eyes - PERRLA, Conjuctiva - clear Sclera- Clear EOMI                Ears - EAC's Wnl TM's Wnl Gross Hearing WNL                 Throat - oropharanx wnl Respiratory - Lungs CTA bilateral Cardiac - RRR S1 and S2 without murmur GI - Abdomen soft Nontender and bowel sounds active x 4 Extremities - general pre-tibial and pedal edema Neuro - Grossly intact.       Assessment & Plan:  URI (upper respiratory infection) - Plan: azithromycin (ZITHROMAX) 250 MG tablet, methylPREDNISolone acetate (DEPO-MEDROL) injection 80 mg  ETD (eustachian tube dysfunction), unspecified laterality - Plan: azithromycin (ZITHROMAX) 250 MG tablet, methylPREDNISolone acetate (DEPO-MEDROL) injection 80 mg  Edema - Plan: furosemide (LASIX) 20 MG tablet po qd 2-3 days prn swelling rx for venous compression stockings  Lysbeth Penner FNP

## 2013-12-27 ENCOUNTER — Telehealth: Payer: Self-pay | Admitting: Family Medicine

## 2013-12-27 NOTE — Telephone Encounter (Signed)
Patient is still having sinus drainage, ear pain. Her feet are still swelling the lasix didn't seem to help with swelling. She wants to know if we can send in antibiotic

## 2013-12-28 ENCOUNTER — Other Ambulatory Visit: Payer: Self-pay | Admitting: Family Medicine

## 2013-12-28 DIAGNOSIS — J069 Acute upper respiratory infection, unspecified: Secondary | ICD-10-CM

## 2013-12-28 DIAGNOSIS — H6990 Unspecified Eustachian tube disorder, unspecified ear: Secondary | ICD-10-CM

## 2013-12-28 DIAGNOSIS — H6980 Other specified disorders of Eustachian tube, unspecified ear: Secondary | ICD-10-CM

## 2013-12-28 MED ORDER — AZITHROMYCIN 250 MG PO TABS: ORAL_TABLET | ORAL | Status: DC

## 2013-12-28 NOTE — Telephone Encounter (Signed)
abx's sent to [facility]

## 2013-12-29 NOTE — Telephone Encounter (Signed)
Patient aware.

## 2014-07-11 ENCOUNTER — Ambulatory Visit (INDEPENDENT_AMBULATORY_CARE_PROVIDER_SITE_OTHER): Payer: Federal, State, Local not specified - PPO | Admitting: Family

## 2014-07-11 ENCOUNTER — Encounter: Payer: Self-pay | Admitting: Family

## 2014-07-11 ENCOUNTER — Ambulatory Visit (INDEPENDENT_AMBULATORY_CARE_PROVIDER_SITE_OTHER): Payer: Federal, State, Local not specified - PPO

## 2014-07-11 VITALS — BP 119/85 | HR 81 | Temp 97.5°F | Ht 65.25 in | Wt 250.0 lb

## 2014-07-11 DIAGNOSIS — Z0000 Encounter for general adult medical examination without abnormal findings: Secondary | ICD-10-CM

## 2014-07-11 DIAGNOSIS — K5900 Constipation, unspecified: Secondary | ICD-10-CM | POA: Diagnosis not present

## 2014-07-11 DIAGNOSIS — R1030 Lower abdominal pain, unspecified: Secondary | ICD-10-CM | POA: Diagnosis not present

## 2014-07-11 DIAGNOSIS — Z23 Encounter for immunization: Secondary | ICD-10-CM | POA: Diagnosis not present

## 2014-07-11 DIAGNOSIS — R0683 Snoring: Secondary | ICD-10-CM

## 2014-07-11 NOTE — Addendum Note (Signed)
Addended by: Earlene Plater on: 07/11/2014 09:19 AM   Modules accepted: Miquel Dunn

## 2014-07-11 NOTE — Patient Instructions (Addendum)
Diverticulitis Diverticulitis is inflammation or infection of small pouches in your colon that form when you have a condition called diverticulosis. The pouches in your colon are called diverticula. Your colon, or large intestine, is where water is absorbed and stool is formed. Complications of diverticulitis can include:  Bleeding.  Severe infection.  Severe pain.  Perforation of your colon.  Obstruction of your colon. CAUSES  Diverticulitis is caused by bacteria. Diverticulitis happens when stool becomes trapped in diverticula. This allows bacteria to grow in the diverticula, which can lead to inflammation and infection. RISK FACTORS People with diverticulosis are at risk for diverticulitis. Eating a diet that does not include enough fiber from fruits and vegetables may make diverticulitis more likely to develop. SYMPTOMS  Symptoms of diverticulitis may include:  Abdominal pain and tenderness. The pain is normally located on the left side of the abdomen, but may occur in other areas.  Fever and chills.  Bloating.  Cramping.  Nausea.  Vomiting.  Constipation.  Diarrhea.  Blood in your stool. DIAGNOSIS  Your health care provider will ask you about your medical history and do a physical exam. You may need to have tests done because many medical conditions can cause the same symptoms as diverticulitis. Tests may include:  Blood tests.  Urine tests.  Imaging tests of the abdomen, including X-rays and CT scans. When your condition is under control, your health care provider may recommend that you have a colonoscopy. A colonoscopy can show how severe your diverticula are and whether something else is causing your symptoms. TREATMENT  Most cases of diverticulitis are mild and can be treated at home. Treatment may include:  Taking over-the-counter pain medicines.  Following a clear liquid diet.  Taking antibiotic medicines by mouth for 7-10 days. More severe cases may  be treated at a hospital. Treatment may include:  Not eating or drinking.  Taking prescription pain medicine.  Receiving antibiotic medicines through an IV tube.  Receiving fluids and nutrition through an IV tube.  Surgery. HOME CARE INSTRUCTIONS   Follow your health care provider's instructions carefully.  Follow a full liquid diet or other diet as directed by your health care provider. After your symptoms improve, your health care provider may tell you to change your diet. He or she may recommend you eat a high-fiber diet. Fruits and vegetables are good sources of fiber. Fiber makes it easier to pass stool.  Take fiber supplements or probiotics as directed by your health care provider.  Only take medicines as directed by your health care provider.  Keep all your follow-up appointments. SEEK MEDICAL CARE IF:   Your pain does not improve.  You have a hard time eating food.  Your bowel movements do not return to normal. SEEK IMMEDIATE MEDICAL CARE IF:   Your pain becomes worse.  Your symptoms do not get better.  Your symptoms suddenly get worse.  You have a fever.  You have repeated vomiting.  You have bloody or black, tarry stools. MAKE SURE YOU:   Understand these instructions.  Will watch your condition.  Will get help right away if you are not doing well or get worse. Document Released: 12/25/2004 Document Revised: 03/22/2013 Document Reviewed: 02/09/2013 [facility] Patient Information 2015 [place], [place]. This information is not intended to replace advice given to you by your health care provider. Make sure you discuss any questions you have with your health care provider. Diverticulosis Diverticulosis is the condition that develops when small pouches (diverticula) form in the  wall of your colon. Your colon, or large intestine, is where water is absorbed and stool is formed. The pouches form when the inside layer of your colon pushes through weak spots in the  outer layers of your colon. CAUSES  No one knows exactly what causes diverticulosis. RISK FACTORS  Being older than 39. Your risk for this condition increases with age. Diverticulosis is rare in people younger than 40 years. By age 57, almost everyone has it.  Eating a low-fiber diet.  Being frequently constipated.  Being overweight.  Not getting enough exercise.  Smoking.  Taking over-the-counter pain medicines, like aspirin and ibuprofen. SYMPTOMS  Most people with diverticulosis do not have symptoms. DIAGNOSIS  Because diverticulosis often has no symptoms, health care providers often discover the condition during an exam for other colon problems. In many cases, a health care provider will diagnose diverticulosis while using a flexible scope to examine the colon (colonoscopy). TREATMENT  If you have never developed an infection related to diverticulosis, you may not need treatment. If you have had an infection before, treatment may include:  Eating more fruits, vegetables, and grains.  Taking a fiber supplement.  Taking a live bacteria supplement (probiotic).  Taking medicine to relax your colon. HOME CARE INSTRUCTIONS   Drink at least 6-8 glasses of water each day to prevent constipation.  Try not to strain when you have a bowel movement.  Keep all follow-up appointments. If you have had an infection before:  Increase the fiber in your diet as directed by your health care provider or dietitian.  Take a dietary fiber supplement if your health care provider approves.  Only take medicines as directed by your health care provider. SEEK MEDICAL CARE IF:   You have abdominal pain.  You have bloating.  You have cramps.  You have not gone to the bathroom in 3 days. SEEK IMMEDIATE MEDICAL CARE IF:   Your pain gets worse.  Yourbloating becomes very bad.  You have a fever or chills, and your symptoms suddenly get worse.  You begin vomiting.  You have bowel  movements that are bloody or black. MAKE SURE YOU:  Understand these instructions.  Will watch your condition.  Will get help right away if you are not doing well or get worse. Document Released: 12/13/2003 Document Revised: 03/22/2013 Document Reviewed: 02/09/2013 [facility] Patient Information 2015 [place], [place]. This information is not intended to replace advice given to you by your health care provider. Make sure you discuss any questions you have with your health care provider. Health Maintenance Adopting a healthy lifestyle and getting preventive care can go a long way to promote health and wellness. Talk with your health care provider about what schedule of regular examinations is right for you. This is a good chance for you to check in with your provider about disease prevention and staying healthy. In between checkups, there are plenty of things you can do on your own. Experts have done a lot of research about which lifestyle changes and preventive measures are most likely to keep you healthy. Ask your health care provider for more information. WEIGHT AND DIET  Eat a healthy diet  Be sure to include plenty of vegetables, fruits, low-fat dairy products, and lean protein.  Do not eat a lot of foods high in solid fats, added sugars, or salt.  Get regular exercise. This is one of the most important things you can do for your health.  Most adults should exercise for at least 150  minutes each week. The exercise should increase your heart rate and make you sweat (moderate-intensity exercise).  Most adults should also do strengthening exercises at least twice a week. This is in addition to the moderate-intensity exercise.  Maintain a healthy weight  Body mass index (BMI) is a measurement that can be used to identify possible weight problems. It estimates body fat based on height and weight. Your health care provider can help determine your BMI and help you achieve or maintain a healthy  weight.  For females 27 years of age and older:   A BMI below 18.5 is considered underweight.  A BMI of 18.5 to 24.9 is normal.  A BMI of 25 to 29.9 is considered overweight.  A BMI of 30 and above is considered obese.  Watch levels of cholesterol and blood lipids  You should start having your blood tested for lipids and cholesterol at 42 years of age, then have this test every 5 years.  You may need to have your cholesterol levels checked more often if:  Your lipid or cholesterol levels are high.  You are older than 42 years of age.  You are at high risk for heart disease.  CANCER SCREENING   Lung Cancer  Lung cancer screening is recommended for adults 71-4 years old who are at high risk for lung cancer because of a history of smoking.  A yearly low-dose CT scan of the lungs is recommended for people who:  Currently smoke.  Have quit within the past 15 years.  Have at least a 30-pack-year history of smoking. A pack year is smoking an average of one pack of cigarettes a day for 1 year.  Yearly screening should continue until it has been 15 years since you quit.  Yearly screening should stop if you develop a health problem that would prevent you from having lung cancer treatment.  Breast Cancer  Practice breast self-awareness. This means understanding how your breasts normally appear and feel.  It also means doing regular breast self-exams. Let your health care provider know about any changes, no matter how small.  If you are in your 20s or 30s, you should have a clinical breast exam (CBE) by a health care provider every 1-3 years as part of a regular health exam.  If you are 75 or older, have a CBE every year. Also consider having a breast X-ray (mammogram) every year.  If you have a family history of breast cancer, talk to your health care provider about genetic screening.  If you are at high risk for breast cancer, talk to your health care provider about  having an MRI and a mammogram every year.  Breast cancer gene (BRCA) assessment is recommended for women who have family members with BRCA-related cancers. BRCA-related cancers include:  Breast.  Ovarian.  Tubal.  Peritoneal cancers.  Results of the assessment will determine the need for genetic counseling and BRCA1 and BRCA2 testing. Cervical Cancer Routine pelvic examinations to screen for cervical cancer are no longer recommended for nonpregnant women who are considered low risk for cancer of the pelvic organs (ovaries, uterus, and vagina) and who do not have symptoms. A pelvic examination may be necessary if you have symptoms including those associated with pelvic infections. Ask your health care provider if a screening pelvic exam is right for you.   The Pap test is the screening test for cervical cancer for women who are considered at risk.  If you had a hysterectomy for a problem  that was not cancer or a condition that could lead to cancer, then you no longer need Pap tests.  If you are older than 65 years, and you have had normal Pap tests for the past 10 years, you no longer need to have Pap tests.  If you have had past treatment for cervical cancer or a condition that could lead to cancer, you need Pap tests and screening for cancer for at least 20 years after your treatment.  If you no longer get a Pap test, assess your risk factors if they change (such as having a new sexual partner). This can affect whether you should start being screened again.  Some women have medical problems that increase their chance of getting cervical cancer. If this is the case for you, your health care provider may recommend more frequent screening and Pap tests.  The human papillomavirus (HPV) test is another test that may be used for cervical cancer screening. The HPV test looks for the virus that can cause cell changes in the cervix. The cells collected during the Pap test can be tested for  HPV.  The HPV test can be used to screen women 102 years of age and older. Getting tested for HPV can extend the interval between normal Pap tests from three to five years.  An HPV test also should be used to screen women of any age who have unclear Pap test results.  After 42 years of age, women should have HPV testing as often as Pap tests.  Colorectal Cancer  This type of cancer can be detected and often prevented.  Routine colorectal cancer screening usually begins at 42 years of age and continues through 42 years of age.  Your health care provider may recommend screening at an earlier age if you have risk factors for colon cancer.  Your health care provider may also recommend using home test kits to check for hidden blood in the stool.  A small camera at the end of a tube can be used to examine your colon directly (sigmoidoscopy or colonoscopy). This is done to check for the earliest forms of colorectal cancer.  Routine screening usually begins at age 72.  Direct examination of the colon should be repeated every 5-10 years through 42 years of age. However, you may need to be screened more often if early forms of precancerous polyps or small growths are found. Skin Cancer  Check your skin from head to toe regularly.  Tell your health care provider about any new moles or changes in moles, especially if there is a change in a mole's shape or color.  Also tell your health care provider if you have a mole that is larger than the size of a pencil eraser.  Always use sunscreen. Apply sunscreen liberally and repeatedly throughout the day.  Protect yourself by wearing long sleeves, pants, a wide-brimmed hat, and sunglasses whenever you are outside. HEART DISEASE, DIABETES, AND HIGH BLOOD PRESSURE   Have your blood pressure checked at least every 1-2 years. High blood pressure causes heart disease and increases the risk of stroke.  If you are between 62 years and 62 years old, ask  your health care provider if you should take aspirin to prevent strokes.  Have regular diabetes screenings. This involves taking a blood sample to check your fasting blood sugar level.  If you are at a normal weight and have a low risk for diabetes, have this test once every three years after 42 years of  age.  If you are overweight and have a high risk for diabetes, consider being tested at a younger age or more often. PREVENTING INFECTION  Hepatitis B  If you have a higher risk for hepatitis B, you should be screened for this virus. You are considered at high risk for hepatitis B if:  You were born in a country where hepatitis B is common. Ask your health care provider which countries are considered high risk.  Your parents were born in a high-risk country, and you have not been immunized against hepatitis B (hepatitis B vaccine).  You have HIV or AIDS.  You use needles to inject street drugs.  You live with someone who has hepatitis B.  You have had sex with someone who has hepatitis B.  You get hemodialysis treatment.  You take certain medicines for conditions, including cancer, organ transplantation, and autoimmune conditions. Hepatitis C  Blood testing is recommended for:  Everyone born from [birth year] through 1963.  Anyone with known risk factors for hepatitis C. Sexually transmitted infections (STIs)  You should be screened for sexually transmitted infections (STIs) including gonorrhea and chlamydia if:  You are sexually active and are younger than 42 years of age.  You are older than 42 years of age and your health care provider tells you that you are at risk for this type of infection.  Your sexual activity has changed since you were last screened and you are at an increased risk for chlamydia or gonorrhea. Ask your health care provider if you are at risk.  If you do not have HIV, but are at risk, it may be recommended that you take a prescription medicine daily to  prevent HIV infection. This is called pre-exposure prophylaxis (PrEP). You are considered at risk if:  You are sexually active and do not regularly use condoms or know the HIV status of your partner(s).  You take drugs by injection.  You are sexually active with a partner who has HIV. Talk with your health care provider about whether you are at high risk of being infected with HIV. If you choose to begin PrEP, you should first be tested for HIV. You should then be tested every 3 months for as long as you are taking PrEP.  PREGNANCY   If you are premenopausal and you may become pregnant, ask your health care provider about preconception counseling.  If you may become pregnant, take 400 to 800 micrograms (mcg) of folic acid every day.  If you want to prevent pregnancy, talk to your health care provider about birth control (contraception). OSTEOPOROSIS AND MENOPAUSE   Osteoporosis is a disease in which the bones lose minerals and strength with aging. This can result in serious bone fractures. Your risk for osteoporosis can be identified using a bone density scan.  If you are 67 years of age or older, or if you are at risk for osteoporosis and fractures, ask your health care provider if you should be screened.  Ask your health care provider whether you should take a calcium or vitamin D supplement to lower your risk for osteoporosis.  Menopause may have certain physical symptoms and risks.  Hormone replacement therapy may reduce some of these symptoms and risks. Talk to your health care provider about whether hormone replacement therapy is right for you.  HOME CARE INSTRUCTIONS   Schedule regular health, dental, and eye exams.  Stay current with your immunizations.   Do not use any tobacco products including cigarettes, chewing tobacco,  or electronic cigarettes.  If you are pregnant, do not drink alcohol.  If you are breastfeeding, limit how much and how often you drink  alcohol.  Limit alcohol intake to no more than 1 drink per day for nonpregnant women. One drink equals 12 ounces of beer, 5 ounces of wine, or 1 ounces of hard liquor.  Do not use street drugs.  Do not share needles.  Ask your health care provider for help if you need support or information about quitting drugs.  Tell your health care provider if you often feel depressed.  Tell your health care provider if you have ever been abused or do not feel safe at home. Document Released: 09/30/2010 Document Revised: 08/01/2013 Document Reviewed: 02/16/2013 [facility] Patient Information 2015 [place], [place]. This information is not intended to replace advice given to you by your health care provider. Make sure you discuss any questions you have with your health care provider. Constipation Constipation is when a person has fewer than three bowel movements a week, has difficulty having a bowel movement, or has stools that are dry, hard, or larger than normal. As people grow older, constipation is more common. If you try to fix constipation with medicines that make you have a bowel movement (laxatives), the problem may get worse. Long-term laxative use may cause the muscles of the colon to become weak. A low-fiber diet, not taking in enough fluids, and taking certain medicines may make constipation worse.  CAUSES   Certain medicines, such as antidepressants, pain medicine, iron supplements, antacids, and water pills.   Certain diseases, such as diabetes, irritable bowel syndrome (IBS), thyroid disease, or depression.   Not drinking enough water.   Not eating enough fiber-rich foods.   Stress or travel.   Lack of physical activity or exercise.   Ignoring the urge to have a bowel movement.   Using laxatives too much.  SIGNS AND SYMPTOMS   Having fewer than three bowel movements a week.   Straining to have a bowel movement.   Having stools that are hard, dry, or larger than normal.    Feeling full or bloated.   Pain in the lower abdomen.   Not feeling relief after having a bowel movement.  DIAGNOSIS  Your health care provider will take a medical history and perform a physical exam. Further testing may be done for severe constipation. Some tests may include:  A barium enema X-ray to examine your rectum, colon, and, sometimes, your small intestine.   A sigmoidoscopy to examine your lower colon.   A colonoscopy to examine your entire colon. TREATMENT  Treatment will depend on the severity of your constipation and what is causing it. Some dietary treatments include drinking more fluids and eating more fiber-rich foods. Lifestyle treatments may include regular exercise. If these diet and lifestyle recommendations do not help, your health care provider may recommend taking over-the-counter laxative medicines to help you have bowel movements. Prescription medicines may be prescribed if over-the-counter medicines do not work.  HOME CARE INSTRUCTIONS   Eat foods that have a lot of fiber, such as fruits, vegetables, whole grains, and beans.  Limit foods high in fat and processed sugars, such as french fries, hamburgers, cookies, candies, and soda.   A fiber supplement may be added to your diet if you cannot get enough fiber from foods.   Drink enough fluids to keep your urine clear or pale yellow.   Exercise regularly or as directed by your health care provider.  Go to the restroom when you have the urge to go. Do not hold it.   Only take over-the-counter or prescription medicines as directed by your health care provider. Do not take other medicines for constipation without talking to your health care provider first.  [place] IF:   You have bright red blood in your stool.   Your constipation lasts for more than 4 days or gets worse.   You have abdominal or rectal pain.   You have thin, pencil-like stools.   You have  unexplained weight loss. MAKE SURE YOU:   Understand these instructions.  Will watch your condition.  Will get help right away if you are not doing well or get worse. Document Released: 12/14/2003 Document Revised: 03/22/2013 Document Reviewed: 12/27/2012 [facility] Patient Information 2015 [place], [place]. This information is not intended to replace advice given to you by your health care provider. Make sure you discuss any questions you have with your health care provider.

## 2014-07-11 NOTE — Progress Notes (Signed)
   Subjective:    Patient ID: Emily Johns, female    DOB: 1970/06/24, 44 y.o.   MRN: [id number]  HPI Pt presents to office today for annual physical without pap. Pt is currently not taking any medications at this time. Pt states she is having problems with constipation, gas pain, and lower abd pain. Pt states she has had her gallbladder and appendix removed. Pt states  these symptoms started over the last few months. Pt states she has not taken anything for these symptoms.    Review of Systems  Constitutional: Negative.   HENT: Negative.   Eyes: Negative.   Respiratory: Negative.  Negative for shortness of breath.   Cardiovascular: Negative.  Negative for palpitations.  Gastrointestinal: Negative.   Endocrine: Negative.   Genitourinary: Negative.   Musculoskeletal: Negative.   Neurological: Negative.  Negative for headaches.  Hematological: Negative.   Psychiatric/Behavioral: Negative.   All other systems reviewed and are negative.      Objective:   Physical Exam  Constitutional: She is oriented to person, place, and time. She appears well-developed and well-nourished. No distress.  HENT:  Head: Normocephalic and atraumatic.  Right Ear: External ear normal.  Mouth/Throat: Oropharynx is clear and moist.  Eyes: Pupils are equal, round, and reactive to light.  Neck: Normal range of motion. Neck supple. No thyromegaly present.  Cardiovascular: Normal rate, regular rhythm, normal heart sounds and intact distal pulses.   No murmur heard. Pulmonary/Chest: Effort normal and breath sounds normal. No respiratory distress. She has no wheezes.  Abdominal: Soft. Bowel sounds are normal. She exhibits no distension. There is no tenderness.  Musculoskeletal: Normal range of motion. She exhibits no edema or tenderness.  Neurological: She is alert and oriented to person, place, and time. She has normal reflexes. No cranial nerve deficit.  Skin: Skin is warm and dry.  Psychiatric: She has a  normal mood and affect. Her behavior is normal. Judgment and thought content normal.  Vitals reviewed.  KUB- Constipation and scoliosis noted- Preliminary reading by Evelina Dun, FNP [facility]   BP 119/85 mmHg  Pulse 81  Temp(Src) 97.5 F (36.4 C) (Oral)  Ht 5' 5.25" (1.657 m)  Wt 250 lb (113.399 kg)  BMI 41.30 kg/m2      Assessment & Plan:  1. Annual physical exam - CMP14+EGFR - Lipid panel - Thyroid Panel With TSH - Vit D  25 hydroxy (rtn osteoporosis monitoring) - DG Abd 1 View; Future - Ambulatory referral to Sleep Studies - Tdap vaccine greater than or equal to 7yo IM  2. Snoring - Ambulatory referral to Sleep Studies  3. Lower abdominal pain - DG Abd 1 View; Future  4. Constipation, unspecified constipation type -Diet discussed  Continue all meds Labs pending Health Maintenance reviewed- Pt to schedule mammogram appt, TDAP given today Diet and exercise encouraged RTO 1 year  Evelina Dun, FNP

## 2014-07-12 ENCOUNTER — Other Ambulatory Visit: Payer: Self-pay | Admitting: Family

## 2014-07-12 DIAGNOSIS — E559 Vitamin D deficiency, unspecified: Secondary | ICD-10-CM

## 2014-07-12 DIAGNOSIS — E782 Mixed hyperlipidemia: Secondary | ICD-10-CM | POA: Insufficient documentation

## 2014-07-12 DIAGNOSIS — E785 Hyperlipidemia, unspecified: Secondary | ICD-10-CM

## 2014-07-12 LAB — LIPID PANEL
CHOL/HDL RATIO: 4 ratio (ref 0.0–4.4)
Cholesterol, Total: 210 mg/dL — ABNORMAL HIGH (ref 100–199)
HDL: 53 mg/dL (ref >39)
LDL CALC: 141 mg/dL — AB (ref 0–99)
TRIGLYCERIDES: 80 mg/dL (ref 0–149)
VLDL CHOLESTEROL CAL: 16 mg/dL (ref 5–40)

## 2014-07-12 LAB — CMP14+EGFR
ALBUMIN: 4.2 g/dL (ref 3.5–5.5)
ALK PHOS: 74 IU/L (ref 39–117)
ALT: 19 IU/L (ref 0–32)
AST: 18 IU/L (ref 0–40)
Albumin/Globulin Ratio: 1.8 (ref 1.1–2.5)
BUN / CREAT RATIO: 14 (ref 9–23)
BUN: 9 mg/dL (ref 6–24)
Bilirubin Total: 0.8 mg/dL (ref 0.0–1.2)
CHLORIDE: 103 mmol/L (ref 97–108)
CO2: 23 mmol/L (ref 18–29)
Calcium: 9.1 mg/dL (ref 8.7–10.2)
Creatinine, Ser: 0.63 mg/dL (ref 0.57–1.00)
GFR calc Af Amer: 128 mL/min/{1.73_m2} (ref >59)
GFR calc non Af Amer: 111 mL/min/{1.73_m2} (ref >59)
GLUCOSE: 79 mg/dL (ref 65–99)
Globulin, Total: 2.4 g/dL (ref 1.5–4.5)
Potassium: 4.8 mmol/L (ref 3.5–5.2)
Sodium: 140 mmol/L (ref 134–144)
TOTAL PROTEIN: 6.6 g/dL (ref 6.0–8.5)

## 2014-07-12 LAB — VITAMIN D 25 HYDROXY (VIT D DEFICIENCY, FRACTURES): Vit D, 25-Hydroxy: 7.5 ng/mL — ABNORMAL LOW (ref 30.0–100.0)

## 2014-07-12 LAB — THYROID PANEL WITH TSH
FREE THYROXINE INDEX: 2.2 (ref 1.2–4.9)
T3 UPTAKE RATIO: 24 % (ref 24–39)
T4, Total: 9.3 ug/dL (ref 4.5–12.0)
TSH: 2.32 u[IU]/mL (ref 0.450–4.500)

## 2014-07-12 MED ORDER — VITAMIN D (ERGOCALCIFEROL) 1.25 MG (50000 UNIT) PO CAPS
50000.0000 [IU] | ORAL_CAPSULE | ORAL | Status: DC
Start: 2014-07-12 — End: 2014-10-13

## 2014-07-12 MED ORDER — SIMVASTATIN 20 MG PO TABS: 20.0000 mg | ORAL_TABLET | Freq: Every day | ORAL | Status: DC

## 2014-07-18 ENCOUNTER — Telehealth: Payer: Self-pay | Admitting: Family

## 2014-07-18 MED ORDER — ALBUTEROL SULFATE HFA 108 (90 BASE) MCG/ACT IN AERS: 2.0000 | INHALATION_SPRAY | Freq: Four times a day (QID) | RESPIRATORY_TRACT | Status: DC | PRN

## 2014-07-18 NOTE — Telephone Encounter (Signed)
Prescription sent to pharmacy.

## 2014-07-19 ENCOUNTER — Other Ambulatory Visit: Payer: Self-pay

## 2014-07-19 DIAGNOSIS — R0683 Snoring: Secondary | ICD-10-CM

## 2014-08-06 ENCOUNTER — Ambulatory Visit: Payer: Federal, State, Local not specified - PPO | Attending: Family | Admitting: Sleep Medicine

## 2014-08-06 DIAGNOSIS — R0683 Snoring: Secondary | ICD-10-CM | POA: Diagnosis not present

## 2014-08-12 NOTE — Sleep Study (Signed)
  [place] A. Merlene Laughter, MD     [email or web address]        NOCTURNAL POLYSOMNOGRAM    LOCATION: SLEEP LAB FACILITY: [facility]   PHYSICIAN: Ervey Fallin A. Merlene Laughter, M.D.   DATE OF STUDY: 08/06/2014.   REFERRING PHYSICIAN: Evelina Dun, FNP.   INDICATIONS: Patient is a 44 year old female who presents with obesity, snoring and daytime fatigue.  MEDICATIONS:  Prior to Admission medications   Medication Sig Start Date End Date Taking? Authorizing Provider  albuterol (PROVENTIL HFA;VENTOLIN HFA) 108 (90 BASE) MCG/ACT inhaler Inhale 2 puffs into the lungs every 6 (six) hours as needed for wheezing or shortness of breath. 07/18/14   Sharion Balloon, FNP  furosemide (LASIX) 20 MG tablet Take one po qd 2-3 days prn swelling Patient not taking: Reported on 07/11/2014 12/19/13   Lysbeth Penner, FNP  simvastatin (ZOCOR) 20 MG tablet Take 1 tablet (20 mg total) by mouth at bedtime. 07/12/14   Sharion Balloon, FNP  Vitamin D, Ergocalciferol, (DRISDOL) 50000 UNITS CAPS capsule Take 1 capsule (50,000 Units total) by mouth every 7 (seven) days. 07/12/14   Sharion Balloon, FNP      EPWORTH SLEEPINESS SCALE: 1.   BMI: 42.   ARCHITECTURAL SUMMARY: Total recording time was 431 minutes. Sleep efficiency 83 %. Sleep latency 36 minutes. REM latency 124 minutes. Stage NI 3 %, N2 51 % and N3 24 % and REM sleep 23 %.    RESPIRATORY DATA:  Baseline oxygen saturation is 98 %. The lowest saturation is 91 %. The diagnostic AHI is 0.3. The RDI is 0.3. The REM AHI is 0.  LIMB MOVEMENT SUMMARY: PLM index 0.   ELECTROCARDIOGRAM SUMMARY: Average heart rate is 81 with no significant dysrhythmias observed.   IMPRESSION:  1. This is a unremarkable nocturnal polysomnography.  Thanks for this referral.  Haidyn Kilburg A. Merlene Laughter, M.D. Diplomat, [employer] adviser of Sleep Medicine.

## 2014-08-22 ENCOUNTER — Telehealth: Payer: Self-pay | Admitting: Family

## 2014-08-22 NOTE — Telephone Encounter (Signed)
Patient is requesting sleep study results please review note on 08/06/2014

## 2014-08-23 ENCOUNTER — Telehealth: Payer: Self-pay | Admitting: Family

## 2014-08-23 NOTE — Telephone Encounter (Signed)
Advised patient of negative sleep study.

## 2014-08-23 NOTE — Telephone Encounter (Signed)
Pt's sleep study was negative. She does not have sleep apnea.

## 2014-08-23 NOTE — Telephone Encounter (Signed)
This was handled in another encounter. Patient aware sleep study negative.

## 2014-09-21 ENCOUNTER — Telehealth: Payer: Self-pay | Admitting: Family

## 2014-09-21 NOTE — Telephone Encounter (Signed)
Pt given appt tomorrow with Alyse Low at 12:25. Pt was offered appt today but she declined.

## 2014-09-22 ENCOUNTER — Encounter: Payer: Self-pay | Admitting: Family

## 2014-09-22 ENCOUNTER — Ambulatory Visit (INDEPENDENT_AMBULATORY_CARE_PROVIDER_SITE_OTHER): Payer: Federal, State, Local not specified - PPO | Admitting: Family

## 2014-09-22 VITALS — BP 120/81 | HR 67 | Temp 97.2°F | Ht 65.0 in | Wt 253.0 lb

## 2014-09-22 DIAGNOSIS — K5900 Constipation, unspecified: Secondary | ICD-10-CM

## 2014-09-22 MED ORDER — LINACLOTIDE 145 MCG PO CAPS: 145.0000 ug | ORAL_CAPSULE | Freq: Every day | ORAL | Status: DC

## 2014-09-22 MED ORDER — LACTULOSE 10 GM/15ML PO SOLN: 30.0000 g | Freq: Two times a day (BID) | ORAL | Status: DC | PRN

## 2014-09-22 NOTE — Progress Notes (Signed)
   Subjective:    Patient ID: Emily Johns, female    DOB: Jun 12, 1970, 44 y.o.   MRN: [id number]  Constipation This is a chronic problem. The current episode started more than 1 month ago. The problem has been waxing and waning since onset. Her stool frequency is 1 time per week or less. The patient is not on a high fiber diet. She does not exercise regularly. There has been adequate water intake. Associated symptoms include abdominal pain, bloating, flatus and nausea. Pertinent negatives include no diarrhea, fecal incontinence or vomiting. Risk factors include obesity and stress. She has tried laxatives, fiber and diet changes (Miralax) for the symptoms. The treatment provided mild relief.      Review of Systems  Constitutional: Negative.   HENT: Negative.   Eyes: Negative.   Respiratory: Negative.  Negative for shortness of breath.   Cardiovascular: Negative.  Negative for palpitations.  Gastrointestinal: Positive for nausea, abdominal pain, constipation, bloating and flatus. Negative for vomiting and diarrhea.  Endocrine: Negative.   Genitourinary: Negative.   Musculoskeletal: Negative.   Neurological: Negative.  Negative for headaches.  Hematological: Negative.   Psychiatric/Behavioral: Negative.   All other systems reviewed and are negative.      Objective:   Physical Exam  Constitutional: She is oriented to person, place, and time. She appears well-developed and well-nourished. No distress.  HENT:  Head: Normocephalic and atraumatic.  Right Ear: External ear normal.  Mouth/Throat: Oropharynx is clear and moist.  Eyes: Pupils are equal, round, and reactive to light.  Neck: Normal range of motion. Neck supple. No thyromegaly present.  Cardiovascular: Normal rate, regular rhythm, normal heart sounds and intact distal pulses.   No murmur heard. Pulmonary/Chest: Effort normal and breath sounds normal. No respiratory distress. She has no wheezes.  Abdominal: Soft. Bowel sounds  are normal. She exhibits no distension. There is no tenderness.  Musculoskeletal: Normal range of motion. She exhibits edema (trace amt in BLE). She exhibits no tenderness.  Neurological: She is alert and oriented to person, place, and time. She has normal reflexes. No cranial nerve deficit.  Skin: Skin is warm and dry.  Psychiatric: She has a normal mood and affect. Her behavior is normal. Judgment and thought content normal.  Vitals reviewed.     BP 120/81 mmHg  Pulse 67  Temp(Src) 97.2 F (36.2 C) (Oral)  Ht 5\' 5"  (1.651 m)  Wt 253 lb (114.76 kg)  BMI 42.10 kg/m2     Assessment & Plan:  1. Constipation, unspecified constipation type -Encourage healthy diet and regular exercise -Force fluids -Take lactulose until "clean out" -RTO in 3 months - Linaclotide (LINZESS) 145 MCG CAPS capsule; Take 1 capsule (145 mcg total) by mouth daily.  Dispense: 90 capsule; Refill: 1 - lactulose (CHRONULAC) 10 GM/15ML solution; Take 45 mLs (30 g total) by mouth 2 (two) times daily as needed for mild constipation.  Dispense: 240 mL; Refill: 0  Evelina Dun, FNP

## 2014-09-22 NOTE — Patient Instructions (Signed)

## 2014-09-26 ENCOUNTER — Telehealth: Payer: Self-pay | Admitting: Nurse Practitioner

## 2014-09-26 DIAGNOSIS — K5900 Constipation, unspecified: Secondary | ICD-10-CM

## 2014-09-27 NOTE — Telephone Encounter (Signed)
Referral sent per pt's request

## 2014-09-27 NOTE — Telephone Encounter (Signed)
Please review and advise.

## 2014-09-28 ENCOUNTER — Encounter: Payer: Self-pay | Admitting: Physician Assistant

## 2014-10-13 ENCOUNTER — Ambulatory Visit (INDEPENDENT_AMBULATORY_CARE_PROVIDER_SITE_OTHER): Payer: Federal, State, Local not specified - PPO | Admitting: Physician Assistant

## 2014-10-13 ENCOUNTER — Encounter: Payer: Self-pay | Admitting: Physician Assistant

## 2014-10-13 VITALS — BP 108/78 | HR 78 | Temp 98.4°F | Ht 64.75 in | Wt 250.2 lb

## 2014-10-13 DIAGNOSIS — K648 Other hemorrhoids: Secondary | ICD-10-CM | POA: Diagnosis not present

## 2014-10-13 DIAGNOSIS — K5909 Other constipation: Secondary | ICD-10-CM | POA: Diagnosis not present

## 2014-10-13 DIAGNOSIS — K219 Gastro-esophageal reflux disease without esophagitis: Secondary | ICD-10-CM | POA: Diagnosis not present

## 2014-10-13 DIAGNOSIS — R1310 Dysphagia, unspecified: Secondary | ICD-10-CM

## 2014-10-13 MED ORDER — HYDROCORTISONE 2.5 % RE CREA: 1.0000 "application " | TOPICAL_CREAM | Freq: Two times a day (BID) | RECTAL | Status: DC

## 2014-10-13 MED ORDER — PANTOPRAZOLE SODIUM 40 MG PO TBEC: 40.0000 mg | DELAYED_RELEASE_TABLET | Freq: Every day | ORAL | Status: DC

## 2014-10-13 NOTE — Progress Notes (Signed)
Patient ID: Emily Johns, female   DOB: July 04, 1970, 44 y.o.   MRN: [id number]    HPI:  Emily Johns is a 44 y.o.   female referred by Sharion Balloon, FNP for evaluation of constipation and dysphagia. Emily Johns reports that she has had trouble with constipation since middle school or high school. She would typically skips 3-4 days between bowel movements and then passed a formed stool. Over the past 6 months she began to skip 5 or 6 days between bowel movements and with then need to drink a bottle of mag citrate to produce a bowel movement. She reports that she has and erratic work schedule and "eats on the fly". She rarely eats fruits or vegetables. She does drink a lot of water but eats mainly fast food. She was recently evaluated by her primary care provider and given a trial of lactulose which provided minimal relief. She had previously tried [place] lax which provided minimal relief as well. She was started on Linzess several weeks ago and with this is having 2 loose bowel movements daily. She reports a sensation of incomplete evacuation and occasionally has a dull ache in the rectum. She does not have a tearing sensation and does not feel as if she is passing groundglass. She has had no bright red blood per rectum or melena. She does have left lower quadrant pain that is exacerbated if she has skip several days between bowel movements. She will typically get cramping in the left lower quadrant prior to defecation and relieved with defecation.  She also reports that she has a history of acid reflux for several years. She occasionally has nocturnal regurgitation. She has heartburn several days per week. She uses toms or Rolaids with minimal relief. For the past 6-7 years, she has had difficulty swallowing meats and dry foods. She has to drink liquids to push her foods down. She has had numerous episodes where she has to spit food up because it will not go down. She denies a globus sensation. She has a history of  asthma and does admit to a nocturnal cough. She also has seasonal allergies but has not correlated an exacerbation of her dysphagia with her seasonal allergies. She has no epigastric pain. Her appetite has been good and she has had no weight loss.   Past Medical History  Diagnosis Date  . PONV (postoperative nausea and vomiting)   . Asthma   . Vitamin D deficiency   . Hyperlipemia   . Arthritis     Past Surgical History  Procedure Laterality Date  . Cesarean section      x3  . Abdominal hysterectomy      partial  . Appendectomy    . Cholecystectomy    . Exploratory laparotomy    . Laparoscopy  05/06/2012    Procedure: LAPAROSCOPY OPERATIVE;  Surgeon: Cheri Fowler, MD;  Location: [facility];  Service: Gynecology;  Laterality: N/A;  . Laparoscopic lysis of adhesions  05/06/2012    Procedure: LAPAROSCOPIC LYSIS OF ADHESIONS;  Surgeon: Cheri Fowler, MD;  Location: [facility];  Service: Gynecology;  Laterality: N/A;  . Salpingoophorectomy  05/06/2012    Procedure: SALPINGO OOPHORECTOMY;  Surgeon: Cheri Fowler, MD;  Location: [facility];  Service: Gynecology;  Laterality: Left;   Family History  Problem Relation Age of Onset  . Hyperlipidemia Father   . Hypertension Father   . Hypertension Sister   . Heart disease Paternal Grandfather    History  Substance Use Topics  .  Smoking status: Never Smoker   . Smokeless tobacco: Never Used  . Alcohol Use: No   Current Outpatient Prescriptions  Medication Sig Dispense Refill  . albuterol (PROVENTIL HFA;VENTOLIN HFA) 108 (90 BASE) MCG/ACT inhaler Inhale 2 puffs into the lungs every 6 (six) hours as needed for wheezing or shortness of breath. 1 Inhaler 2  . ibuprofen (ADVIL,MOTRIN) 200 MG tablet Take 200 mg by mouth as needed.    . Linaclotide (LINZESS) 145 MCG CAPS capsule Take 1 capsule (145 mcg total) by mouth daily. 90 capsule 1  . simvastatin (ZOCOR) 20 MG tablet Take 1 tablet (20 mg total) by mouth at bedtime. 90 tablet 3  . hydrocortisone  (ANUSOL-HC) 2.5 % rectal cream Place 1 application rectally 2 (two) times daily. 30 g 0  . pantoprazole (PROTONIX) 40 MG tablet Take 1 tablet (40 mg total) by mouth daily with breakfast. 30 tablet 11   No current facility-administered medications for this visit.   Allergies  Allergen Reactions  . Shellfish Allergy Shortness Of Breath    Selling, throat closes  . Iodinated Diagnostic Agents     Iodine, betadine  . Lodine [Etodolac]   . Penicillins Swelling and Rash     Review of Systems: Gen: Denies any fever, chills, sweats, anorexia, fatigue, weakness, malaise, weight loss, and sleep disorder CV: Denies chest pain, angina, palpitations, syncope, orthopnea, PND, peripheral edema, and claudication. Resp: Denies dyspnea at rest, dyspnea with exercise, cough, sputum, wheezing, coughing up blood, and pleurisy. GI: Denies vomiting blood, jaundice, and fecal incontinence.  Has dysphagia to solids. GU : Denies urinary burning, blood in urine, urinary frequency, urinary hesitancy, nocturnal urination, and urinary incontinence. MS: Denies joint pain, limitation of movement, and swelling, stiffness, low back pain, extremity pain. Denies muscle weakness, cramps, atrophy.  Derm: Denies rash, itching, dry skin, hives, moles, warts, or unhealing ulcers.  Psych: Denies depression, anxiety, memory loss, suicidal ideation, hallucinations, paranoia, and confusion. Heme: Denies bruising, bleeding, and enlarged lymph nodes. Neuro:  Denies any headaches, dizziness, paresthesias. Endo:  Denies any problems with DM, thyroid, adrenal function  Studies:CLINICAL DATA: Three-week history. Lower abdominal pain.  EXAM: ABDOMEN - 1 VIEW  COMPARISON: None.  FINDINGS: The bowel gas pattern is normal. No radio-opaque calculi or other significant radiographic abnormality are seen. Moderate stool burden hepatic flexure. Scoliosis. There appears to be a small surgical clip lying within or overlying the  abdomen projecting to the LEFT of L3-L4. This probably relates to prior abdominal surgery.  IMPRESSION: No acute intra-abdominal findings.   Electronically Signed  By: Rolla Flatten M.D.  On: 07/11/2014 11:21   LAB RESULTS: TSH on 07/11/2014 2.32    Physical Exam: BP 108/78 mmHg  Pulse 78  Temp(Src) 98.4 F (36.9 C)  Ht 5' 4.75" (1.645 m)  Wt 250 lb 4 oz (113.513 kg)  BMI 41.95 kg/m2 Constitutional: Pleasant,well-developed Caucasian female in no acute distress. HEENT: Normocephalic and atraumatic. Conjunctivae are normal. No scleral icterus. Neck supple. No cervical adenopathy Cardiovascular: Normal rate, regular rhythm.  Pulmonary/chest: Effort normal and breath sounds normal. No wheezing, rales or rhonchi. Abdominal: Soft, nondistended, nontender. Bowel sounds active throughout. There are no masses palpable. No hepatomegaly. Rectal: No skin tags or external hemorrhoids noted. Brown stool, heme negative. Anoscopy reveals internal hemorrhoid. Extremities: no edema Lymphadenopathy: No cervical adenopathy noted. Neurological: Alert and oriented to person place and time. Skin: Skin is warm and dry. No rashes noted. Psychiatric: Normal mood and affect. Behavior is normal.  ASSESSMENT AND PLAN: #1. GERD  and dysphagia. An antireflux regimen has been reviewed. She will be given a trial of pantoprazole 40 mg by mouth every morning 30 minutes prior to breakfast. She will be scheduled for barium swallow with tablet to evaluate for possible stricture. She will then be scheduled for an EGD to assess for esophagitis, stricture, gastritis, ulcer, etc.The risks, benefits, and alternatives to endoscopy with possible biopsy and possible dilation were discussed with the patient and they consent to proceed.  The procedure will be scheduled with Dr. Deatra Ina.  #2. Constipation. This is been a chronic problem for patient. She's been instructed to try to add fiber and "P fruits" to her diet. She  will increase water intake. She will continue linzess 145 g daily.  #3. Internal hemorrhoids. He'll be given a trial of Anusol H suppositories 1 per rectum twice a day for 10 days.  Further recommendations will be made pending the findings of the above.    Cora Brierley, DONELL SLIWINSKI 10/13/2014, 11:38 AM  CC: Sharion Balloon, FNP

## 2014-10-13 NOTE — Patient Instructions (Addendum)
We have sent the following medications to your pharmacy for you to pick up at your convenience:pantoprazole and anusol cream.  Continue your Linzess.   Patient advised to avoid spicy, acidic, citrus, chocolate, mints, fruit and fruit juices.  Limit the intake of caffeine, alcohol and Soda.  Don't exercise too soon after eating.  Don't lie down within 3-4 hours of eating.  Elevate the head of your bed.  You have been scheduled for a Barium Esophogram at [facility] Radiology department on 10/18/14 at 11:00am. Please arrive 15 minutes prior to your appointment for registration. Make certain not to have anything to eat or drink 6 hours prior to your test. If you need to reschedule for any reason, please contact radiology at [phone] to do so. __________________________________________________________________ A barium swallow is an examination that concentrates on views of the esophagus. This tends to be a double contrast exam (barium and two liquids which, when combined, create a gas to distend the wall of the oesophagus) or single contrast (non-ionic iodine based). The study is usually tailored to your symptoms so a good history is essential. Attention is paid during the study to the form, structure and configuration of the esophagus, looking for functional disorders (such as aspiration, dysphagia, achalasia, motility and reflux) EXAMINATION You may be asked to change into a gown, depending on the type of swallow being performed. A radiologist and radiographer will perform the procedure. The radiologist will advise you of the type of contrast selected for your procedure and direct you during the exam. You will be asked to stand, sit or lie in several different positions and to hold a small amount of fluid in your mouth before being asked to swallow while the imaging is performed .In some instances you may be asked to swallow barium coated marshmallows to assess the motility of a solid food  bolus. The exam can be recorded as a digital or video fluoroscopy procedure. POST PROCEDURE It will take 1-2 days for the barium to pass through your system. To facilitate this, it is important, unless otherwise directed, to increase your fluids for the next 24-48hrs and to resume your normal diet.  This test typically takes about 30 minutes to perform. __________________________________________________________________________________    Dennis Bast have been scheduled for an endoscopy. Please follow written instructions given to you at your visit today. If you use inhalers (even only as needed), please bring them with you on the day of your procedure. Your physician has requested that you go to www.startemmi.com and enter the access code given to you at your visit today. This web site gives a general overview about your procedure. However, you should still follow specific instructions given to you by our office regarding your preparation for the procedure.  Eat more "p" fruits such as peaches, pears, plums, pineapple, and pear juice.

## 2014-10-18 ENCOUNTER — Ambulatory Visit (HOSPITAL_COMMUNITY)
Admission: RE | Admit: 2014-10-18 | Discharge: 2014-10-18 | Disposition: A | Discharge status: Home / Self Care | Payer: Federal, State, Local not specified - PPO | Source: Ambulatory Visit | Attending: Physician Assistant | Admitting: Physician Assistant

## 2014-10-18 DIAGNOSIS — K219 Gastro-esophageal reflux disease without esophagitis: Secondary | ICD-10-CM

## 2014-10-18 DIAGNOSIS — R1310 Dysphagia, unspecified: Secondary | ICD-10-CM | POA: Diagnosis present

## 2014-11-01 ENCOUNTER — Encounter: Payer: Self-pay | Admitting: Gastroenterology

## 2014-11-01 ENCOUNTER — Ambulatory Visit (AMBULATORY_SURGERY_CENTER): Payer: Federal, State, Local not specified - PPO | Admitting: Gastroenterology

## 2014-11-01 VITALS — BP 113/75 | HR 56 | Temp 98.1°F | Resp 16 | Ht 64.75 in | Wt 250.0 lb

## 2014-11-01 DIAGNOSIS — R1310 Dysphagia, unspecified: Secondary | ICD-10-CM

## 2014-11-01 DIAGNOSIS — K222 Esophageal obstruction: Secondary | ICD-10-CM | POA: Diagnosis not present

## 2014-11-01 MED ORDER — SODIUM CHLORIDE 0.9 % IV SOLN: 500.0000 mL | INTRAVENOUS | Status: DC

## 2014-11-01 NOTE — Progress Notes (Signed)
Report to PACU, RN, vss, BBS= Clear.  

## 2014-11-01 NOTE — Patient Instructions (Signed)
YOU HAD AN ENDOSCOPIC PROCEDURE TODAY AT [facility]:   Refer to the procedure report that was given to you for any specific questions about what was found during the examination.  If the procedure report does not answer your questions, please call your gastroenterologist to clarify.  If you requested that your care partner not be given the details of your procedure findings, then the procedure report has been included in a sealed envelope for you to review at your convenience later.  YOU SHOULD EXPECT: Some feelings of bloating in the abdomen. Passage of more gas than usual.  Walking can help get rid of the air that was put into your GI tract during the procedure and reduce the bloating. If you had a lower endoscopy (such as a colonoscopy or flexible sigmoidoscopy) you may notice spotting of blood in your stool or on the toilet paper. If you underwent a bowel prep for your procedure, you may not have a normal bowel movement for a few days.  Please Note:  You might notice some irritation and congestion in your nose or some drainage.  This is from the oxygen used during your procedure.  There is no need for concern and it should clear up in a day or so.  SYMPTOMS TO REPORT IMMEDIATELY:    Following upper endoscopy (EGD)  Vomiting of blood or coffee ground material  New chest pain or pain under the shoulder blades  Painful or persistently difficult swallowing  New shortness of breath  Fever of 100F or higher  Black, tarry-looking stools  For urgent or emergent issues, a gastroenterologist can be reached at any hour by calling [phone].   DIET: NOTHING TO EAT OR DRINK UNTIL 3:30. 3:30 UNTIL 4:30 ONLY CLEAR LIQUIDS. AFTER 4:30 TODAY UNTIL MORNING ONLY SOFT FOODS. RESUME YOUR REGULAR DIET IN AM.  ACTIVITY:  You should plan to take it easy for the rest of today and you should NOT DRIVE or use heavy machinery until tomorrow (because of the sedation medicines used during the  test).    FOLLOW UP: Our staff will call the number listed on your records the next business day following your procedure to check on you and address any questions or concerns that you may have regarding the information given to you following your procedure. If we do not reach you, we will leave a message.  However, if you are feeling well and you are not experiencing any problems, there is no need to return our call.  We will assume that you have returned to your regular daily activities without incident.  If any biopsies were taken you will be contacted by phone or by letter within the next 1-3 weeks.  Please call us at [phone] if you have not heard about the biopsies in 3 weeks.    SIGNATURES/CONFIDENTIALITY: You and/or your care partner have signed paperwork which will be entered into your electronic medical record.  These signatures attest to the fact that that the information above on your After Visit Summary has been reviewed and is understood.  Full responsibility of the confidentiality of this discharge information lies with you and/or your care-partner.

## 2014-11-01 NOTE — Op Note (Signed)
[place]  [employer]. [place], [address]   ENDOSCOPY PROCEDURE REPORT  PATIENT: [name], Emily Johns  MR#: [id number] BIRTHDATE: 11-Nov-1970 , 44  yrs. old GENDER: female ENDOSCOPIST: Inda Castle, MD REFERRED BY:  Breck Coons, N.P. PROCEDURE DATE:  11/01/2014 PROCEDURE:  EGD w/ balloon dilation ASA CLASS:     Class II INDICATIONS:  dysphagia. MEDICATIONS: Monitored anesthesia care and Propofol 200 mg IV TOPICAL ANESTHETIC:  DESCRIPTION OF PROCEDURE: After the risks benefits and alternatives of the procedure were thoroughly explained, informed consent was obtained.  The LB [id number] endoscope was introduced through the mouth and advanced to the second portion of the duodenum , Without limitations.  The instrument was slowly withdrawn as the mucosa was fully examined.    ESOPHAGUS: There was a peptic stricture at the gastroesophageal junction.  The stricture was traversable.  Using a TTS-balloon the stricture was dilated up to 48mm.  The balloon was held inflated for 30 seconds. numbers 16 and 17 mm balloon dilators were inflated for 30 seconds each as well. Following this dilation, there was a small amount of heme.      STOMACH: A cm hiatal hernia was noted. an 8 cm sliding hiatal hernia was noted.  Retroflexed views revealed no abnormalities.   The remainder the exam was normal including the distal stomach and duodenum.  The scope was then withdrawn from the patient and the procedure completed.  COMPLICATIONS: There were no immediate complications.  ENDOSCOPIC IMPRESSION: 1.   There was a stricture at the gastroesophageal junction; Using a TTS-balloon the stricture was dilated up to 16mm; The balloon was held inflated for 30 seconds; Following this dilation, there was a small amount of heme  3.   8 Cm hiatal hernia was noted.  RECOMMENDATIONS: Repeat dilation as necessary  REPEAT EXAM:  eSigned:  Inda Castle, MD 11/01/2014 2:36  PM    CC:

## 2014-11-01 NOTE — Progress Notes (Signed)
Called to room to assist during endoscopic procedure.  Patient ID and intended procedure confirmed with present staff. Received instructions for my participation in the procedure from the performing physician.  

## 2014-11-02 ENCOUNTER — Telehealth: Payer: Self-pay

## 2014-11-02 NOTE — Telephone Encounter (Signed)
  Follow up Call-  Call back number 11/01/2014  Post procedure Call Back phone  # [phone]  Permission to leave phone message Yes     Patient questions:  Do you have a fever, pain , or abdominal swelling? No. Pain Score  0 *  Have you tolerated food without any problems? Yes.    Have you been able to return to your normal activities? Yes.    Do you have any questions about your discharge instructions: Diet   No. Medications  No. Follow up visit  No.  Do you have questions or concerns about your Care? No.  Actions: * If pain score is 4 or above: No action needed, pain <4.

## 2015-06-07 DIAGNOSIS — M26623 Arthralgia of bilateral temporomandibular joint: Secondary | ICD-10-CM | POA: Insufficient documentation

## 2015-06-07 DIAGNOSIS — J310 Chronic rhinitis: Secondary | ICD-10-CM | POA: Insufficient documentation

## 2015-06-07 DIAGNOSIS — J329 Chronic sinusitis, unspecified: Secondary | ICD-10-CM | POA: Insufficient documentation

## 2015-07-16 DIAGNOSIS — J329 Chronic sinusitis, unspecified: Secondary | ICD-10-CM | POA: Diagnosis not present

## 2015-12-21 DIAGNOSIS — K080 Exfoliation of teeth due to systemic causes: Secondary | ICD-10-CM | POA: Diagnosis not present

## 2016-03-17 DIAGNOSIS — J019 Acute sinusitis, unspecified: Secondary | ICD-10-CM | POA: Diagnosis not present

## 2016-03-17 DIAGNOSIS — J209 Acute bronchitis, unspecified: Secondary | ICD-10-CM | POA: Diagnosis not present

## 2016-05-13 ENCOUNTER — Encounter: Payer: Self-pay | Admitting: Physician Assistant

## 2016-05-13 ENCOUNTER — Ambulatory Visit (INDEPENDENT_AMBULATORY_CARE_PROVIDER_SITE_OTHER): Payer: Federal, State, Local not specified - PPO | Admitting: Physician Assistant

## 2016-05-13 VITALS — BP 110/72 | HR 79 | Temp 99.3°F | Ht 64.75 in | Wt 238.8 lb

## 2016-05-13 DIAGNOSIS — R079 Chest pain, unspecified: Secondary | ICD-10-CM

## 2016-05-13 DIAGNOSIS — R002 Palpitations: Secondary | ICD-10-CM

## 2016-05-13 NOTE — Patient Instructions (Signed)
Palpitations A palpitation is the feeling that your heartbeat is irregular or is faster than normal. It may feel like your heart is fluttering or skipping a beat. Palpitations are usually not a serious problem. They may be caused by many things, including smoking, caffeine, alcohol, stress, and certain medicines. Although most causes of palpitations are not serious, palpitations can be a sign of a serious medical problem. In some cases, you may need further medical evaluation. Follow these instructions at home: Pay attention to any changes in your symptoms. Take these actions to help with your condition:  Avoid the following: ? Caffeinated coffee, tea, soft drinks, diet pills, and energy drinks. ? Chocolate. ? Alcohol.  Do not use any tobacco products, such as cigarettes, chewing tobacco, and e-cigarettes. If you need help quitting, ask your health care provider.  Try to reduce your stress and anxiety. Things that can help you relax include: ? Yoga. ? Meditation. ? Physical activity, such as swimming, jogging, or walking. ? Biofeedback. This is a method that helps you learn to use your mind to control things in your body, such as your heartbeats.  Get plenty of rest and sleep.  Take over-the-counter and prescription medicines only as told by your health care provider.  Keep all follow-up visits as told by your health care provider. This is important.  Contact a health care provider if:  You continue to have a fast or irregular heartbeat after 24 hours.  Your palpitations occur more often. Get help right away if:  You have chest pain or shortness of breath.  You have a severe headache.  You feel dizzy or you faint. This information is not intended to replace advice given to you by your health care provider. Make sure you discuss any questions you have with your health care provider. Document Released: 03/14/2000 Document Revised: 08/20/2015 Document Reviewed: 11/30/2014 Elsevier  Interactive Patient Education  2017 Elsevier Inc.  

## 2016-05-14 DIAGNOSIS — R079 Chest pain, unspecified: Secondary | ICD-10-CM | POA: Insufficient documentation

## 2016-05-14 DIAGNOSIS — R002 Palpitations: Secondary | ICD-10-CM | POA: Insufficient documentation

## 2016-05-14 NOTE — Progress Notes (Signed)
BP 110/72   Pulse 79   Temp 99.3 F (37.4 C) (Oral)   Ht 5' 4.75" (1.645 m)   Wt 238 lb 12.8 oz (108.3 kg)   BMI 40.05 kg/m    Subjective:    Patient ID: Emily Johns, female    DOB: 04-17-1970, 46 y.o.   MRN: [id number]  HPI: Emily Johns is a 46 y.o. female presenting on 05/13/2016 for Palpitations (comes and goes for the last 3 weeks and twice she felt like she was going to pass out )  Patient states in just the recent week she has had 2 episodes where she had some heart palpitations and she soon felt like she was given a pass out she was not exerting herself when this happened. She has never had these before. She is not had any changes in her general health. She is not taking extra caffeine. She will sometimes feel a skipped beat sensation when she is at rest at when she lays down at night. She has mild discomfort that is in the midsternal area but quickly goes away. She denies any associated symptoms of diaphoresis pain or radiation of arm  Relevant past medical, surgical, family and social history reviewed and updated as indicated. Allergies and medications reviewed and updated.  Past Medical History:  Diagnosis Date  . Arthritis   . Asthma   . Hyperlipemia   . PONV (postoperative nausea and vomiting)   . Vitamin D deficiency     Past Surgical History:  Procedure Laterality Date  . ABDOMINAL HYSTERECTOMY     partial  . APPENDECTOMY    . CESAREAN SECTION     x3  . CHOLECYSTECTOMY    . EXPLORATORY LAPAROTOMY    . LAPAROSCOPIC LYSIS OF ADHESIONS  05/06/2012   Procedure: LAPAROSCOPIC LYSIS OF ADHESIONS;  Surgeon: Cheri Fowler, MD;  Location: [facility];  Service: Gynecology;  Laterality: N/A;  . LAPAROSCOPY  05/06/2012   Procedure: LAPAROSCOPY OPERATIVE;  Surgeon: Cheri Fowler, MD;  Location: [facility];  Service: Gynecology;  Laterality: N/A;  . SALPINGOOPHORECTOMY  05/06/2012   Procedure: SALPINGO OOPHORECTOMY;  Surgeon: Cheri Fowler, MD;  Location: [facility];  Service:  Gynecology;  Laterality: Left;    Review of Systems  Constitutional: Negative.  Negative for activity change, fatigue and fever.  HENT: Negative.   Eyes: Negative.   Respiratory: Negative.  Negative for cough, chest tightness, shortness of breath, wheezing and stridor.   Cardiovascular: Positive for palpitations. Negative for chest pain and leg swelling.  Gastrointestinal: Negative.  Negative for abdominal pain.  Endocrine: Negative.   Genitourinary: Negative.  Negative for dysuria.  Musculoskeletal: Negative.   Skin: Negative.   Neurological: Negative.     Allergies as of 05/13/2016      Reactions   Shellfish Allergy Shortness Of Breath   Selling, throat closes   Iodinated Diagnostic Agents    Iodine, betadine   Levaquin [levofloxacin In D5w]    Lodine [etodolac]    Penicillins Swelling, Rash      Medication List       Accurate as of 05/13/16 11:59 PM. Always use your most recent med list.          albuterol 108 (90 Base) MCG/ACT inhaler Commonly known as:  PROVENTIL HFA;VENTOLIN HFA Inhale 2 puffs into the lungs every 6 (six) hours as needed for wheezing or shortness of breath.   ibuprofen 200 MG tablet Commonly known as:  ADVIL,MOTRIN Take 200 mg by mouth as needed.  linaclotide 145 MCG Caps capsule Commonly known as:  LINZESS Take 1 capsule (145 mcg total) by mouth daily.          Objective:    BP 110/72   Pulse 79   Temp 99.3 F (37.4 C) (Oral)   Ht 5' 4.75" (1.645 m)   Wt 238 lb 12.8 oz (108.3 kg)   BMI 40.05 kg/m   Allergies  Allergen Reactions  . Shellfish Allergy Shortness Of Breath    Selling, throat closes  . Iodinated Diagnostic Agents     Iodine, betadine  . Levaquin [Levofloxacin In D5w]   . Lodine [Etodolac]   . Penicillins Swelling and Rash    Physical Exam  Constitutional: She is oriented to person, place, and time. She appears well-developed and well-nourished.  HENT:  Head: Normocephalic and atraumatic.  Right Ear:  Tympanic membrane, external ear and ear canal normal.  Left Ear: Tympanic membrane, external ear and ear canal normal.  Nose: Nose normal. No rhinorrhea.  Mouth/Throat: Oropharynx is clear and moist and mucous membranes are normal. No oropharyngeal exudate or posterior oropharyngeal erythema.  Eyes: Conjunctivae and EOM are normal. Pupils are equal, round, and reactive to light.  Neck: Normal range of motion. Neck supple.  Cardiovascular: Normal rate, regular rhythm, normal heart sounds and intact distal pulses.  Exam reveals no gallop and no friction rub.   No murmur heard. Pulmonary/Chest: Effort normal and breath sounds normal.  Abdominal: Soft. Bowel sounds are normal.  Neurological: She is alert and oriented to person, place, and time. She has normal reflexes.  Skin: Skin is warm and dry. No rash noted.  Psychiatric: She has a normal mood and affect. Her behavior is normal. Judgment and thought content normal.        Assessment & Plan:   1. Palpitations - EKG 12-Lead - Ambulatory referral to Cardiology  2. Chest pain, unspecified type - EKG 12-Lead   Continue all other maintenance medications as listed above.  Follow up plan: Return if symptoms worsen or fail to improve.  Educational handout given for palpitations  Terald Sleeper PA-C [facility] [address]  [place], [place] [address] [phone]   05/14/2016, 8:14 AM

## 2016-05-27 ENCOUNTER — Ambulatory Visit (HOSPITAL_COMMUNITY)
Admission: RE | Admit: 2016-05-27 | Discharge: 2016-05-27 | Disposition: A | Discharge status: Home / Self Care | Payer: Federal, State, Local not specified - PPO | Source: Ambulatory Visit | Attending: Cardiology | Admitting: Cardiology

## 2016-05-27 ENCOUNTER — Ambulatory Visit (INDEPENDENT_AMBULATORY_CARE_PROVIDER_SITE_OTHER): Payer: Federal, State, Local not specified - PPO | Admitting: Cardiology

## 2016-05-27 ENCOUNTER — Encounter: Payer: Self-pay | Admitting: Cardiology

## 2016-05-27 VITALS — BP 118/74 | HR 86 | Ht 63.0 in | Wt 238.0 lb

## 2016-05-27 DIAGNOSIS — R002 Palpitations: Secondary | ICD-10-CM

## 2016-05-27 DIAGNOSIS — R079 Chest pain, unspecified: Secondary | ICD-10-CM | POA: Diagnosis not present

## 2016-05-27 DIAGNOSIS — I340 Nonrheumatic mitral (valve) insufficiency: Secondary | ICD-10-CM | POA: Insufficient documentation

## 2016-05-27 DIAGNOSIS — I071 Rheumatic tricuspid insufficiency: Secondary | ICD-10-CM | POA: Insufficient documentation

## 2016-05-27 DIAGNOSIS — E785 Hyperlipidemia, unspecified: Secondary | ICD-10-CM | POA: Insufficient documentation

## 2016-05-27 DIAGNOSIS — R600 Localized edema: Secondary | ICD-10-CM | POA: Diagnosis not present

## 2016-05-27 DIAGNOSIS — R0602 Shortness of breath: Secondary | ICD-10-CM | POA: Diagnosis not present

## 2016-05-27 LAB — ECHOCARDIOGRAM COMPLETE
Height: 63 in
Weight: 3808 oz

## 2016-05-27 MED ORDER — METOPROLOL TARTRATE 25 MG PO TABS: 25.0000 mg | ORAL_TABLET | Freq: Two times a day (BID) | ORAL | 3 refills | Status: DC

## 2016-05-27 NOTE — Progress Notes (Signed)
Clinical Summary Emily Johns is a 46 y.o.female seen as a new consult. Referred by PA Particia Nearing   1. Palpitations - started about 1 month ago. Feeling of heart skipping. Can feel dizzy. +SOB. "feels like smothering". Can have have left sided chest pain, aching pain.  - 2 severe episodes, multiple other mild episodes.  - episodes last a few minutes in general, can be longer and can be recurrent  - no specific trigger. No relation to stress - roughly 20 episodes over the last 2 weeks - no coffee, occasional tea, occasional sodas (diet Dr pepper x 2 cans), no energy drinks, no EtOH. No specific relation to albuterol   2. LE edema - started a few years ago - some DOE walking up inclines as well    Past Medical History:  Diagnosis Date  . Arthritis   . Asthma   . Hyperlipemia   . PONV (postoperative nausea and vomiting)   . Vitamin D deficiency      Allergies  Allergen Reactions  . Shellfish Allergy Shortness Of Breath    Selling, throat closes  . Iodinated Diagnostic Agents     Iodine, betadine  . Levaquin [Levofloxacin In D5w]   . Lodine [Etodolac]   . Penicillins Swelling and Rash     Current Outpatient Prescriptions  Medication Sig Dispense Refill  . albuterol (PROVENTIL HFA;VENTOLIN HFA) 108 (90 BASE) MCG/ACT inhaler Inhale 2 puffs into the lungs every 6 (six) hours as needed for wheezing or shortness of breath. 1 Inhaler 2  . ibuprofen (ADVIL,MOTRIN) 200 MG tablet Take 200 mg by mouth as needed.    . Linaclotide (LINZESS) 145 MCG CAPS capsule Take 1 capsule (145 mcg total) by mouth daily. 90 capsule 1   No current facility-administered medications for this visit.      Past Surgical History:  Procedure Laterality Date  . ABDOMINAL HYSTERECTOMY     partial  . APPENDECTOMY    . CESAREAN SECTION     x3  . CHOLECYSTECTOMY    . EXPLORATORY LAPAROTOMY    . LAPAROSCOPIC LYSIS OF ADHESIONS  05/06/2012   Procedure: LAPAROSCOPIC LYSIS OF ADHESIONS;  Surgeon:  Cheri Fowler, MD;  Location: [facility];  Service: Gynecology;  Laterality: N/A;  . LAPAROSCOPY  05/06/2012   Procedure: LAPAROSCOPY OPERATIVE;  Surgeon: Cheri Fowler, MD;  Location: [facility];  Service: Gynecology;  Laterality: N/A;  . SALPINGOOPHORECTOMY  05/06/2012   Procedure: SALPINGO OOPHORECTOMY;  Surgeon: Cheri Fowler, MD;  Location: [facility];  Service: Gynecology;  Laterality: Left;     Allergies  Allergen Reactions  . Shellfish Allergy Shortness Of Breath    Selling, throat closes  . Iodinated Diagnostic Agents     Iodine, betadine  . Levaquin [Levofloxacin In D5w]   . Lodine [Etodolac]   . Penicillins Swelling and Rash      Family History  Problem Relation Age of Onset  . Hyperlipidemia Father   . Hypertension Father   . Hypertension Sister   . Heart disease Paternal Grandfather      Social History Emily Johns reports that she has never smoked. She has never used smokeless tobacco. Emily Johns reports that she does not drink alcohol.   Review of Systems CONSTITUTIONAL: No weight loss, fever, chills, weakness or fatigue.  HEENT: Eyes: No visual loss, blurred vision, double vision or yellow sclerae.No hearing loss, sneezing, congestion, runny nose or sore throat.  SKIN: No rash or itching.  CARDIOVASCULAR: per HPI RESPIRATORY: per HPI  GASTROINTESTINAL: No anorexia, nausea, vomiting or diarrhea. No abdominal pain or blood.  GENITOURINARY: No burning on urination, no polyuria NEUROLOGICAL: No headache, dizziness, syncope, paralysis, ataxia, numbness or tingling in the extremities. No change in bowel or bladder control.  MUSCULOSKELETAL: No muscle, back pain, joint pain or stiffness.  LYMPHATICS: No enlarged nodes. No history of splenectomy.  PSYCHIATRIC: No history of depression or anxiety.  ENDOCRINOLOGIC: No reports of sweating, cold or heat intolerance. No polyuria or polydipsia.  Marland Kitchen   Physical Examination Vitals:   05/27/16 0845 05/27/16 0846  BP: 122/78 118/74    Pulse: 86    Vitals:   05/27/16 0845  Weight: 238 lb (108 kg)  Height: 5\' 3"  (1.6 m)    Gen: resting comfortably, no acute distress HEENT: no scleral icterus, pupils equal round and reactive, no palptable cervical adenopathy,  CV: RRR, no m/r/g, no jvd Resp: Clear to auscultation bilaterally GI: abdomen is soft, non-tender, non-distended, normal bowel sounds, no hepatosplenomegaly MSK: extremities are warm, no edema.  Skin: warm, no rash Neuro:  no focal deficits Psych: appropriate affect     Assessment and Plan  1. Palpitations - baseline EKG shows NSR - will obtain 2 week event monitor to further evaluate - check K,Mg, TSH - start lopressor 25mg  prn  2. DOE - DOE and LE edema - will obtain echo to evaluate for cardiac dysfunction        Arnoldo Lenis, M.D.

## 2016-05-27 NOTE — Patient Instructions (Signed)
Medication Instructions:  START LOPRESSOR 25 MG - TAKE 1 TABLET EVERY 12 HOURS AS NEEDED FOR PALPITATIONS   Labwork: Your physician recommends that you return for lab work in: ASAP    Testing/Procedures: Your physician has requested that you have an echocardiogram. Echocardiography is a painless test that uses sound waves to create images of your heart. It provides your doctor with information about the size and shape of your heart and how well your heart's chambers and valves are working. This procedure takes approximately one hour. There are no restrictions for this procedure.  Your physician has recommended that you wear an event monitor. Event monitors are medical devices that record the heart's electrical activity. Doctors most often [place] these monitors to diagnose arrhythmias. Arrhythmias are problems with the speed or rhythm of the heartbeat. The monitor is a small, portable device. You can wear one while you do your normal daily activities. This is usually used to diagnose what is causing palpitations/syncope (passing out).    Follow-Up: Your physician recommends that you schedule a follow-up appointment in: 3-4 WEEKS    Any Other Special Instructions Will Be Listed Below (If Applicable).     If you need a refill on your cardiac medications before your next appointment, please call your pharmacy.

## 2016-05-27 NOTE — Progress Notes (Signed)
*  PRELIMINARY RESULTS* Echocardiogram 2D Echocardiogram has been performed.  Emily Johns 05/27/2016, 10:13 AM

## 2016-05-30 ENCOUNTER — Other Ambulatory Visit (HOSPITAL_COMMUNITY)
Admission: RE | Admit: 2016-05-30 | Discharge: 2016-05-30 | Disposition: A | Discharge status: Home / Self Care | Payer: Federal, State, Local not specified - PPO | Source: Ambulatory Visit | Attending: Cardiology | Admitting: Cardiology

## 2016-05-30 DIAGNOSIS — R002 Palpitations: Secondary | ICD-10-CM | POA: Diagnosis not present

## 2016-05-30 DIAGNOSIS — R079 Chest pain, unspecified: Secondary | ICD-10-CM | POA: Diagnosis not present

## 2016-05-30 LAB — COMPREHENSIVE METABOLIC PANEL
ALBUMIN: 3.9 g/dL (ref 3.5–5.0)
ALT: 15 U/L (ref 14–54)
AST: 17 U/L (ref 15–41)
Alkaline Phosphatase: 59 U/L (ref 38–126)
Anion gap: 5 (ref 5–15)
BUN: 9 mg/dL (ref 6–20)
CHLORIDE: 106 mmol/L (ref 101–111)
CO2: 25 mmol/L (ref 22–32)
Calcium: 8.6 mg/dL — ABNORMAL LOW (ref 8.9–10.3)
Creatinine, Ser: 0.58 mg/dL (ref 0.44–1.00)
GFR calc Af Amer: >60 mL/min (ref >60)
GLUCOSE: 82 mg/dL (ref 65–99)
POTASSIUM: 3.9 mmol/L (ref 3.5–5.1)
Sodium: 136 mmol/L (ref 135–145)
Total Bilirubin: 1.1 mg/dL (ref 0.3–1.2)
Total Protein: 7.1 g/dL (ref 6.5–8.1)

## 2016-05-30 LAB — CBC WITH DIFFERENTIAL/PLATELET
BASOS PCT: 0 %
Basophils Absolute: 0 10*3/uL (ref 0.0–0.1)
EOS ABS: 0.2 10*3/uL (ref 0.0–0.7)
Eosinophils Relative: 4 %
HEMATOCRIT: 39.1 % (ref 36.0–46.0)
HEMOGLOBIN: 13.1 g/dL (ref 12.0–15.0)
LYMPHS ABS: 1.6 10*3/uL (ref 0.7–4.0)
Lymphocytes Relative: 34 %
MCH: 30.8 pg (ref 26.0–34.0)
MCHC: 33.5 g/dL (ref 30.0–36.0)
MCV: 91.8 fL (ref 78.0–100.0)
MONO ABS: 0.4 10*3/uL (ref 0.1–1.0)
MONOS PCT: 8 %
NEUTROS PCT: 54 %
Neutro Abs: 2.6 10*3/uL (ref 1.7–7.7)
Platelets: 276 10*3/uL (ref 150–400)
RBC: 4.26 MIL/uL (ref 3.87–5.11)
RDW: 12.8 % (ref 11.5–15.5)
WBC: 4.8 10*3/uL (ref 4.0–10.5)

## 2016-05-30 LAB — LIPID PANEL
CHOL/HDL RATIO: 3.9 ratio
Cholesterol: 181 mg/dL (ref 0–200)
HDL: 46 mg/dL (ref >40)
LDL Cholesterol: 125 mg/dL — ABNORMAL HIGH (ref 0–99)
Triglycerides: 50 mg/dL (ref <150)
VLDL: 10 mg/dL (ref 0–40)

## 2016-05-30 LAB — TSH: TSH: 1.968 u[IU]/mL (ref 0.350–4.500)

## 2016-05-30 LAB — MAGNESIUM: MAGNESIUM: 1.9 mg/dL (ref 1.7–2.4)

## 2016-05-31 ENCOUNTER — Ambulatory Visit (INDEPENDENT_AMBULATORY_CARE_PROVIDER_SITE_OTHER): Payer: Federal, State, Local not specified - PPO

## 2016-05-31 DIAGNOSIS — R002 Palpitations: Secondary | ICD-10-CM

## 2016-05-31 LAB — HEMOGLOBIN A1C
Hgb A1c MFr Bld: 4.8 % (ref 4.8–5.6)
MEAN PLASMA GLUCOSE: 91 mg/dL

## 2016-06-24 ENCOUNTER — Telehealth: Payer: Self-pay

## 2016-06-24 NOTE — Telephone Encounter (Signed)
-----   Message from Arnoldo Lenis, MD sent at 06/24/2016 10:04 AM EDT ----- Heart monitor shows just some occasional extra heart beats, sometimes she will have a few extra heart beats in a row but there are no significant abnormal rhythms. How are her symptoms doing? Has she tried the prn lopressor?  J BrancH MD

## 2016-06-24 NOTE — Telephone Encounter (Signed)
If palpitations become severe would try prn lopressor. If they become frequent would try taking lopressor 25mg  bid to suppress them. If symptoms are tolerable then can hold off on taking medication   Zandra Abts MD

## 2016-06-24 NOTE — Telephone Encounter (Signed)
Emily Johns returned my call. She was made aware of her results. She stated she has not tried the lopresser, but is still having some palpitations here and there.

## 2016-06-24 NOTE — Telephone Encounter (Signed)
Called pt. Left message for pt to return call.  

## 2016-06-24 NOTE — Telephone Encounter (Signed)
Pt made aware, copy to pcp.  

## 2016-06-26 DIAGNOSIS — K080 Exfoliation of teeth due to systemic causes: Secondary | ICD-10-CM | POA: Diagnosis not present

## 2016-06-27 ENCOUNTER — Ambulatory Visit: Payer: Federal, State, Local not specified - PPO | Admitting: Adult Health

## 2016-06-27 ENCOUNTER — Ambulatory Visit (INDEPENDENT_AMBULATORY_CARE_PROVIDER_SITE_OTHER): Payer: Federal, State, Local not specified - PPO | Admitting: Cardiology

## 2016-06-27 ENCOUNTER — Encounter: Payer: Self-pay | Admitting: Cardiology

## 2016-06-27 DIAGNOSIS — R002 Palpitations: Secondary | ICD-10-CM

## 2016-06-27 DIAGNOSIS — E669 Obesity, unspecified: Secondary | ICD-10-CM

## 2016-06-27 NOTE — Patient Instructions (Signed)
Your physician recommends that you schedule a follow-up appointment in:  As needed        Thank you for choosing [facility] !         

## 2016-06-27 NOTE — Progress Notes (Signed)
06/27/2016 Emily Johns   [birth year]  [id number]  Primary Physician Mary-Margaret Hassell Done, FNP Primary Cardiologist: Dr Harl Bowie  HPI:  Pleasant 46 y/o female referred to Dr Harl Bowie for palpitations. The pt had an echo, labs, and Holter. Her echo was normal and her Holter showed PACs and one run of 3 beat atrial tachycardia. She is in the office today for follow up. Dr Harl Bowie had prescribed her Lopressor 25 mg BID PRN but she hasn't used this. She still has occasional palpitations but no sustained tachycardia.    Current Outpatient Prescriptions  Medication Sig Dispense Refill  . albuterol (PROVENTIL HFA;VENTOLIN HFA) 108 (90 BASE) MCG/ACT inhaler Inhale 2 puffs into the lungs every 6 (six) hours as needed for wheezing or shortness of breath. 1 Inhaler 2  . fluticasone (FLONASE) 50 MCG/ACT nasal spray Place 2 sprays into both nostrils daily.    Marland Kitchen ibuprofen (ADVIL,MOTRIN) 200 MG tablet Take 200 mg by mouth as needed.    . Linaclotide (LINZESS) 145 MCG CAPS capsule Take 1 capsule (145 mcg total) by mouth daily. 90 capsule 1  . metoprolol tartrate (LOPRESSOR) 25 MG tablet Take 1 tablet (25 mg total) by mouth 2 (two) times daily. As needed for palpitations. 180 tablet 3   No current facility-administered medications for this visit.     Allergies  Allergen Reactions  . Shellfish Allergy Shortness Of Breath    Selling, throat closes  . Iodinated Diagnostic Agents     Iodine, betadine  . Levaquin [Levofloxacin In D5w]   . Lodine [Etodolac]   . Penicillins Swelling and Rash    Past Medical History:  Diagnosis Date  . Arthritis   . Asthma   . Hyperlipemia   . PONV (postoperative nausea and vomiting)   . Vitamin D deficiency     Social History   Social History  . Marital status: Married    Spouse name: N/A  . Number of children: 3  . Years of education: N/A   Occupational History  . insurance agent    Social History Main Topics  . Smoking status: Never Smoker  . Smokeless  tobacco: Never Used  . Alcohol use No  . Drug use: No  . Sexual activity: Yes    Birth control/ protection: Surgical, None     Comment: hysterectomy   Other Topics Concern  . Not on file   Social History Narrative  . No narrative on file     Family History  Problem Relation Age of Onset  . Hyperlipidemia Father   . Hypertension Father   . Hypertension Sister   . Heart disease Paternal Grandfather      Review of Systems: General: negative for chills, fever, night sweats or weight changes.  Cardiovascular: negative for chest pain, dyspnea on exertion, edema, orthopnea, palpitations, paroxysmal nocturnal dyspnea or shortness of breath Dermatological: negative for rash Respiratory: negative for cough or wheezing Urologic: negative for hematuria Abdominal: negative for nausea, vomiting, diarrhea, bright red blood per rectum, melena, or hematemesis Neurologic: negative for visual changes, syncope, or dizziness All other systems reviewed and are otherwise negative except as noted above.    Blood pressure 128/78, pulse 97, height 5\' 6"  (1.676 m), weight 241 lb (109.3 kg), SpO2 97 %.  General appearance: alert, cooperative, no distress and moderately obese Lungs: clear to auscultation bilaterally Heart: regular rate and rhythm, S1, S2 normal, no murmur, click, rub or gallop Extremities: extremities normal, atraumatic, no cyanosis or edema Neurologic: Grossly normal  ASSESSMENT AND PLAN:   Palpitations Echo normal, Holter showed occasional PACs and a run of 3 beat atrial tach  Obesity (BMI 30-39.9) BMI 38, sleep study was negative 2 years ago   PLAN  We discussed avoidance of stimulants- OTC decongestants, and caffeine. I suggested a regular walking program, she is obese and her resting HR is a little high. We will be glad to see her back PRN.    Kerin Ransom PA-C 06/27/2016 3:46 PM

## 2016-06-27 NOTE — Assessment & Plan Note (Signed)
BMI 38, sleep study was negative 2 years ago

## 2016-06-27 NOTE — Assessment & Plan Note (Signed)
Echo normal, Holter showed occasional PACs and a run of 3 beat atrial tach

## 2016-08-20 ENCOUNTER — Ambulatory Visit (INDEPENDENT_AMBULATORY_CARE_PROVIDER_SITE_OTHER): Payer: Federal, State, Local not specified - PPO | Admitting: Gastroenterology

## 2016-08-20 ENCOUNTER — Encounter: Payer: Self-pay | Admitting: Gastroenterology

## 2016-08-20 VITALS — BP 128/70 | HR 74 | Ht 65.5 in | Wt 244.1 lb

## 2016-08-20 DIAGNOSIS — K5900 Constipation, unspecified: Secondary | ICD-10-CM

## 2016-08-20 DIAGNOSIS — R194 Change in bowel habit: Secondary | ICD-10-CM

## 2016-08-20 MED ORDER — HYDROCORTISONE ACETATE 25 MG RE SUPP: RECTAL | 2 refills | Status: DC

## 2016-08-20 MED ORDER — NA SULFATE-K SULFATE-MG SULF 17.5-3.13-1.6 GM/177ML PO SOLN: 1.0000 | Freq: Once | ORAL | 0 refills | Status: AC

## 2016-08-20 MED ORDER — LINACLOTIDE 72 MCG PO CAPS: 72.0000 ug | ORAL_CAPSULE | Freq: Every day | ORAL | 3 refills | Status: DC

## 2016-08-20 NOTE — Patient Instructions (Addendum)
We have sent the following medications to your pharmacy for you to pick up at your convenience: 1. Linzess 145 mcg 2. Anusol HC suppositories  Get and drink 1 bottle of Magnesium citrate this evening.   You have been scheduled for a colonoscopy. Please follow written instructions given to you at your visit today.  Please pick up your prep supplies at the pharmacy within the next 1-3 days. If you use inhalers (even only as needed), please bring them with you on the day of your procedure. Your physician has requested that you go to www.startemmi.com and enter the access code given to you at your visit today. This web site gives a general overview about your procedure. However, you should still follow specific instructions given to you by our office regarding your preparation for the procedure.

## 2016-08-20 NOTE — Progress Notes (Addendum)
08/20/2016 Emily Johns [id number] September 04, 1970   HISTORY OF PRESENT ILLNESS:  This is a 46 year old female who was previously known to Dr. Deatra Ina.  Her care is being assumed by Dr. Hilarie Fredrickson.  Emily Johns reports that she has had trouble with constipation since middle school or high school.  She was seen here for this issues by one of our PA's back in 09/2014.  She reports that she has and erratic work schedule and "eats on the fly". She rarely eats fruits or vegetables. She does drink a lot of water but eats mainly fast food. She has tried lactulose, which provided minimal relief. She had previously tried [place] lax which provided minimal relief as well, but unsure if she was taking this daily. She was started on Linzess 145 mcg back in 2016 and continues with that but only uses it prn.  She says that she feels that the constipation is worsening because she "has to use the Linzess more often".  Is only taking it 1-2 times per month because when she takes it it gives her diarrhea, but she still does not feel like she is completely empty.  She reports a dull ache in the rectum.  She has had no bright red blood per rectum or melena. She complained of this in 2016 as well and anoscopy revealed internal hemorrhoids at that time.  She does have left lower quadrant pain that is exacerbated if she has skip several days between bowel movements.  Recent TSH is normal.   Past Medical History:  Diagnosis Date  . Arthritis   . Asthma   . Hyperlipemia   . PONV (postoperative nausea and vomiting)   . Vitamin D deficiency    Past Surgical History:  Procedure Laterality Date  . ABDOMINAL HYSTERECTOMY     partial  . APPENDECTOMY    . CESAREAN SECTION     x3  . CHOLECYSTECTOMY    . EXPLORATORY LAPAROTOMY    . LAPAROSCOPIC LYSIS OF ADHESIONS  05/06/2012   Procedure: LAPAROSCOPIC LYSIS OF ADHESIONS;  Surgeon: Cheri Fowler, MD;  Location: [facility];  Service: Gynecology;  Laterality: N/A;  . LAPAROSCOPY  05/06/2012   Procedure: LAPAROSCOPY OPERATIVE;  Surgeon: Cheri Fowler, MD;  Location: [facility];  Service: Gynecology;  Laterality: N/A;  . SALPINGOOPHORECTOMY  05/06/2012   Procedure: SALPINGO OOPHORECTOMY;  Surgeon: Cheri Fowler, MD;  Location: [facility];  Service: Gynecology;  Laterality: Left;    reports that she has never smoked. She has never used smokeless tobacco. She reports that she does not drink alcohol or use drugs. family history includes Heart disease in her paternal grandfather; Hyperlipidemia in her father; Hypertension in her father and sister. Allergies  Allergen Reactions  . Shellfish Allergy Shortness Of Breath    Selling, throat closes  . Iodinated Diagnostic Agents     Iodine, betadine  . Levaquin [Levofloxacin In D5w]   . Lodine [Etodolac]   . Penicillins Swelling and Rash      Outpatient Encounter Prescriptions as of 08/20/2016  Medication Sig  . albuterol (PROVENTIL HFA;VENTOLIN HFA) 108 (90 BASE) MCG/ACT inhaler Inhale 2 puffs into the lungs every 6 (six) hours as needed for wheezing or shortness of breath.  . fluticasone (FLONASE) 50 MCG/ACT nasal spray Place 2 sprays into both nostrils daily.  Marland Kitchen ibuprofen (ADVIL,MOTRIN) 200 MG tablet Take 200 mg by mouth as needed.  . Linaclotide (LINZESS) 145 MCG CAPS capsule Take 1 capsule (145 mcg total) by mouth daily.  Marland Kitchen  metoprolol tartrate (LOPRESSOR) 25 MG tablet Take 1 tablet (25 mg total) by mouth 2 (two) times daily. As needed for palpitations.   No facility-administered encounter medications on file as of 08/20/2016.      REVIEW OF SYSTEMS  : All other systems reviewed and negative except where noted in the History of Present Illness.   PHYSICAL EXAM: BP 128/70   Pulse 74   Ht 5' 5.5" (1.664 m)   Wt 244 lb 1.6 oz (110.7 kg)   BMI 40.00 kg/m  General: Well developed white female in no acute distress Head: Normocephalic and atraumatic Eyes:  Sclerae anicteric, conjunctiva pink. Ears: Normal auditory acuity Lungs: Clear  throughout to auscultation Heart: Regular rate and rhythm Abdomen: Soft, non-distended. Normal bowel sounds.  Non-tender. Rectal:  Will be done at the time of colonoscopy.  Previously had internal hemorrhoids on anoscopy. Musculoskeletal: Symmetrical with no gross deformities  Skin: No lesions on visible extremities Extremities: No edema  Neurological: Alert oriented x 4, grossly non-focal Psychological:  Alert and cooperative. Normal mood and affect  ASSESSMENT AND PLAN: #1. Chronic/life-long constipation. This is been a chronic problem for patient. She's been instructed to try to add fiber and "P fruits" and other dietary fiber to her diet. She will take some magnesium citrate this evening to try to clean out.  We will decrease the Linzess to 72 g daily with hopes that it will cause less loose stools and that she can take it more regularly.  Although I doubt any obstructive or structural cause of symptoms since this has been going on so long, it has worsened/changed with time.  I don't think that it is unreasonable to schedule colonoscopy.  Patient can always call to cancel if she gets very good results with this regimen and feels well.  Will schedule with Dr. Hilarie Fredrickson for now.   #2. Internal hemorrhoids:  Seen previously on anoscopy when she had similar complaints.  She will be given Anusolsuppositories 1 per rectum at bedtime for 10 days.  *The risks, benefits, and alternatives to colonoscopy were discussed with the patient and she consents to proceed.  CC:  Hassell Done, Mary-Margaret, *   Addendum: Reviewed and agree with initial management. Pyrtle, Lajuan Lines, MD

## 2016-09-09 ENCOUNTER — Encounter: Payer: Self-pay | Admitting: Internal Medicine

## 2016-09-18 ENCOUNTER — Telehealth: Payer: Self-pay | Admitting: *Deleted

## 2016-09-18 ENCOUNTER — Telehealth: Payer: Self-pay | Admitting: Gastroenterology

## 2016-09-18 MED ORDER — POLYETHYLENE GLYCOL 3350 17 GM/SCOOP PO POWD: 1.0000 | Freq: Every day | ORAL | 3 refills | Status: DC

## 2016-09-18 NOTE — Telephone Encounter (Signed)
Called the patient and per Emily Bogus, PA, the other medication like Linzess is Amitiza. However they are about the same price. The patient said the Linzess is too expensive. Last year she got 90 day for 35.00.  This year it was 150.00.  Emily Johns suggested taking Miralax 17 grams in 8 oz of water daily. If not effective she can take twice daily, am and pm.  I offered to send a script to her pharmacy for the generic Miralax and the patient agreed to that. Told her to call us in 2 weeks and let us know if this has helped or not.

## 2016-09-18 NOTE — Telephone Encounter (Signed)
Sent generic Miralax per patient request.

## 2016-09-23 ENCOUNTER — Encounter: Payer: Federal, State, Local not specified - PPO | Admitting: Internal Medicine

## 2016-12-05 ENCOUNTER — Encounter: Payer: Self-pay | Admitting: Nurse Practitioner

## 2016-12-05 ENCOUNTER — Ambulatory Visit (INDEPENDENT_AMBULATORY_CARE_PROVIDER_SITE_OTHER): Payer: Federal, State, Local not specified - PPO | Admitting: Nurse Practitioner

## 2016-12-05 VITALS — BP 114/80 | HR 92 | Ht 64.75 in | Wt 243.1 lb

## 2016-12-05 DIAGNOSIS — R1310 Dysphagia, unspecified: Secondary | ICD-10-CM | POA: Diagnosis not present

## 2016-12-05 NOTE — Patient Instructions (Signed)
If you are age 44 or older, your body mass index should be between 23-30. Your Body mass index is 40.77 kg/m. If this is out of the aforementioned range listed, please consider follow up with your Primary Care Provider.  If you are age 33 or younger, your body mass index should be between 19-25. Your Body mass index is 40.77 kg/m. If this is out of the aformentioned range listed, please consider follow up with your Primary Care Provider.   You have been scheduled for an endoscopy. Please follow written instructions given to you at your visit today. If you use inhalers (even only as needed), please bring them with you on the day of your procedure. Your physician has requested that you go to www.startemmi.com and enter the access code given to you at your visit today. This web site gives a general overview about your procedure. However, you should still follow specific instructions given to you by our office regarding your preparation for the procedure.  Thank you for choosing [facility]

## 2016-12-05 NOTE — Progress Notes (Addendum)
     HPI: Patient is a 46 year old female known to Dr. Hilarie Fredrickson.  In 2016 she was evaluated for dysphagia. 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 An 8 cm hiatal hernia was noted.  Post dilation patient did well for over year. In the last 6 months she has begun having solid food dysphagia again. The dysphagia has progressed significantly over the last couple of months. She now has difficulty swallowing with every meal. She frequently vomits up lodged food. No odynophagia. No chest pain.  No SOB.  No GERD symptoms.   Past Medical History:  Diagnosis Date  . Arthritis   . Asthma   . Hyperlipemia   . PONV (postoperative nausea and vomiting)   . Vitamin D deficiency     Patient's surgical history, family medical history, social history, medications and allergies were all reviewed in Epic    Physical Exam: BP 114/80 (BP Location: Left Arm, Patient Position: Sitting, Cuff Size: Large)   Pulse 92   Ht 5' 4.75" (1.645 m)   Wt 243 lb 2 oz (110.3 kg)   BMI 40.77 kg/m   GENERAL: obese white female in NAD PSYCH: :Pleasant, cooperative, normal affect EENT:  conjunctiva pink, mucous membranes moist, neck supple without masses CARDIAC:  RRR,  murmur heard, no peripheral edema PULM: Normal respiratory effort, lungs CTA bilaterally, no wheezing ABDOMEN:  soft, nontender, nondistended, no obvious masses, no hepatomegaly,  normal bowel sounds SKIN:  turgor, no lesions seen Musculoskeletal:  Normal muscle tone, normal strength NEURO: Alert and oriented x 3, no focal neurologic deficits  ASSESSMENT and PLAN:  Pleasant 46 year old female with recurrent solid food dysphagia over the last six months. She is s/p balloon dilation of GEJ stricture 2 years ago.  -She needs EGD with dilation. The risks and benefits of EGD with dilation were discussed and the patient agrees to proceed.  -Advised patient to eat small bites, chew well with liquids in between bites to avoid  food impaction.   Tye Savoy , NP 12/05/2016, 10:11 AM   Addendum: Reviewed and agree with management. Pyrtle, Lajuan Lines, MD

## 2016-12-10 ENCOUNTER — Encounter: Payer: Self-pay | Admitting: Internal Medicine

## 2016-12-10 ENCOUNTER — Ambulatory Visit (AMBULATORY_SURGERY_CENTER): Payer: Federal, State, Local not specified - PPO | Admitting: Internal Medicine

## 2016-12-10 VITALS — BP 109/82 | HR 72 | Temp 97.5°F | Resp 16 | Ht 64.0 in | Wt 243.0 lb

## 2016-12-10 DIAGNOSIS — K222 Esophageal obstruction: Secondary | ICD-10-CM | POA: Diagnosis not present

## 2016-12-10 DIAGNOSIS — R1310 Dysphagia, unspecified: Secondary | ICD-10-CM

## 2016-12-10 DIAGNOSIS — R1319 Other dysphagia: Secondary | ICD-10-CM

## 2016-12-10 MED ORDER — PANTOPRAZOLE SODIUM 40 MG PO TBEC: DELAYED_RELEASE_TABLET | ORAL | 6 refills | Status: DC

## 2016-12-10 MED ORDER — SODIUM CHLORIDE 0.9 % IV SOLN: 500.0000 mL | INTRAVENOUS | Status: DC

## 2016-12-10 NOTE — Patient Instructions (Addendum)
Pick up and star pantoprazole  See appointments for pre visit and for repeat Endoscopy with dilatation  Soft diet -follow dilatation diet today  YOU HAD AN ENDOSCOPIC PROCEDURE TODAY AT [place]:   Refer to the procedure report that was given to you for any specific questions about what was found during the examination.  If the procedure report does not answer your questions, please call your gastroenterologist to clarify.  If you requested that your care partner not be given the details of your procedure findings, then the procedure report has been included in a sealed envelope for you to review at your convenience later.  YOU SHOULD EXPECT: Some feelings of bloating in the abdomen. Passage of more gas than usual.  Walking can help get rid of the air that was put into your GI tract during the procedure and reduce the bloating. If you had a lower endoscopy (such as a colonoscopy or flexible sigmoidoscopy) you may notice spotting of blood in your stool or on the toilet paper. If you underwent a bowel prep for your procedure, you may not have a normal bowel movement for a few days.  Please Note:  You might notice some irritation and congestion in your nose or some drainage.  This is from the oxygen used during your procedure.  There is no need for concern and it should clear up in a day or so.  SYMPTOMS TO REPORT IMMEDIATELY:     Following upper endoscopy (EGD)  Vomiting of blood or coffee ground material  New chest pain or pain under the shoulder blades  Painful or persistently difficult swallowing  New shortness of breath  Fever of 100F or higher  Black, tarry-looking stools  For urgent or emergent issues, a gastroenterologist can be reached at any hour by calling [phone].   DIET:  Dilatation diet -soft foods & chew well until further notice    Drink plenty of fluids but you should avoid alcoholic beverages for 24 hours.  ACTIVITY:  You should plan to take  it easy for the rest of today and you should NOT DRIVE or use heavy machinery until tomorrow (because of the sedation medicines used during the test).    FOLLOW UP: Our staff will call the number listed on your records the next business day following your procedure to check on you and address any questions or concerns that you may have regarding the information given to you following your procedure. If we do not reach you, we will leave a message.  However, if you are feeling well and you are not experiencing any problems, there is no need to return our call.  We will assume that you have returned to your regular daily activities without incident.  If any biopsies were taken you will be contacted by phone or by letter within the next 1-3 weeks.  Please call us at [phone] if you have not heard about the biopsies in 3 weeks.    SIGNATURES/CONFIDENTIALITY: You and/or your care partner have signed paperwork which will be entered into your electronic medical record.  These signatures attest to the fact that that the information above on your After Visit Summary has been reviewed and is understood.  Full responsibility of the confidentiality of this discharge information lies with you and/or your care-partner.

## 2016-12-10 NOTE — Progress Notes (Signed)
Called to room to assist during endoscopic procedure.  Patient ID and intended procedure confirmed with present staff. Received instructions for my participation in the procedure from the performing physician.  

## 2016-12-10 NOTE — Progress Notes (Signed)
Pt's states no medical or surgical changes since previsit or office visit. 

## 2016-12-10 NOTE — Op Note (Signed)
Welcome Patient Name: Emily Johns Procedure Date: 12/10/2016 8:33 AM MRN: [id number] Endoscopist: Jerene Bears , MD Age: 46 Referring MD:  Date of Birth: 08-Sep-1970 Gender: Female Account #: [id number] Procedure:                Upper GI endoscopy Indications:              Dysphagia, For therapy of esophageal stricture                            (dilation to 18 mm in 2016 with Dr. Deatra Ina) Medicines:                Monitored Anesthesia Care Procedure:                Pre-Anesthesia Assessment:                           - Prior to the procedure, a History and Physical                            was performed, and patient medications and                            allergies were reviewed. The patient's tolerance of                            previous anesthesia was also reviewed. The risks                            and benefits of the procedure and the sedation                            options and risks were discussed with the patient.                            All questions were answered, and informed consent                            was obtained. Prior Anticoagulants: The patient has                            taken no previous anticoagulant or antiplatelet                            agents. ASA Grade Assessment: II - A patient with                            mild systemic disease. After reviewing the risks                            and benefits, the patient was deemed in                            satisfactory condition to undergo the procedure.  After obtaining informed consent, the endoscope was                            passed under direct vision. Throughout the                            procedure, the patient's blood pressure, pulse, and                            oxygen saturations were monitored continuously. The                            Endoscope was introduced through the mouth, and                            advanced to the  second part of duodenum. The upper                            GI endoscopy was accomplished without difficulty.                            The patient tolerated the procedure well. Scope In: Scope Out: Findings:                 One moderate (circumferential scarring or stenosis;                            an endoscope may pass) benign-appearing, intrinsic                            stenosis was found 35 cm from the incisors. This                            measured less than one cm (in length) and was                            traversed. A TTS dilator was passed through the                            scope. Dilation with an 02-10-12 mm balloon dilator                            was performed to 13 mm. The dilation site was                            examined and showed moderate improvement in luminal                            narrowing with mucosa rents at level of the ring in                            2 locations. Further dilation with larger balloon  not attempted due to result of dilation at 13 mm.                            Estimated blood loss was minimal.                           A large hiatal hernia with multiple Cameron                            erosions was found. The proximal extent of the                            gastric folds (end of tubular esophagus) was 35 cm                            from the incisors. The hiatal narrowing was 42 cm                            from the incisors.                           The exam of the stomach was otherwise normal.                           The examined duodenum was normal. Complications:            No immediate complications. Estimated Blood Loss:     Estimated blood loss was minimal. Impression:               - Benign-appearing esophageal stenosis. Dilated to                            13 mm with balloon.                           - Large hiatal hernia with multiple Cameron's                             erosions/lesions.                           - Normal examined duodenum.                           - No specimens collected. Recommendation:           - Patient has a contact number available for                            emergencies. The signs and symptoms of potential                            delayed complications were discussed with the                            patient. Return to normal activities tomorrow.  Written discharge instructions were provided to the                            patient.                           - Soft diet.                           - Continue present medications.                           - Begin pantoprazole 40 mg once daily (30 min                            before breakfast).                           - Repeat upper endoscopy in 1 month for retreatment. Jerene Bears, MD 12/10/2016 9:04:27 AM This report has been signed electronically.

## 2016-12-10 NOTE — Progress Notes (Signed)
Report to PACU, RN, vss, BBS= Clear.  

## 2016-12-11 ENCOUNTER — Telehealth: Payer: Self-pay | Admitting: *Deleted

## 2016-12-11 NOTE — Telephone Encounter (Signed)
  Follow up Call-  Call back number 12/10/2016 11/01/2014  Post procedure Call Back phone  # [phone]  Permission to leave phone message Yes Yes  Some recent data might be hidden     Patient questions:  Do you have a fever, pain , or abdominal swelling? No. Pain Score  0 *  Have you tolerated food without any problems? Yes.    Have you been able to return to your normal activities? Yes.    Do you have any questions about your discharge instructions: Diet   No. Medications  No. Follow up visit  No.  Do you have questions or concerns about your Care? No.  Actions: * If pain score is 4 or above: No action needed, pain <4.

## 2017-01-07 ENCOUNTER — Ambulatory Visit (AMBULATORY_SURGERY_CENTER): Payer: Self-pay | Admitting: *Deleted

## 2017-01-07 VITALS — Ht 64.0 in | Wt 248.0 lb

## 2017-01-07 DIAGNOSIS — R1319 Other dysphagia: Secondary | ICD-10-CM

## 2017-01-07 DIAGNOSIS — R1310 Dysphagia, unspecified: Secondary | ICD-10-CM

## 2017-01-07 NOTE — Progress Notes (Signed)
Patient denies any allergies to eggs or soy. Patient denies any problems with anesthesia/sedation. Patient denies any oxygen use at home. Patient denies taking any diet/weight loss medications or blood thinners. Patient declined EMMI.

## 2017-01-14 ENCOUNTER — Ambulatory Visit (AMBULATORY_SURGERY_CENTER): Payer: Federal, State, Local not specified - PPO | Admitting: Internal Medicine

## 2017-01-14 ENCOUNTER — Encounter: Payer: Self-pay | Admitting: Internal Medicine

## 2017-01-14 VITALS — BP 122/73 | HR 74 | Temp 98.6°F | Resp 17 | Ht 64.0 in | Wt 248.0 lb

## 2017-01-14 DIAGNOSIS — R1310 Dysphagia, unspecified: Secondary | ICD-10-CM | POA: Diagnosis present

## 2017-01-14 DIAGNOSIS — K222 Esophageal obstruction: Secondary | ICD-10-CM | POA: Diagnosis not present

## 2017-01-14 DIAGNOSIS — R1319 Other dysphagia: Secondary | ICD-10-CM

## 2017-01-14 MED ORDER — SODIUM CHLORIDE 0.9 % IV SOLN: 500.0000 mL | INTRAVENOUS | Status: DC

## 2017-01-14 NOTE — Progress Notes (Signed)
Pt's states no medical or surgical changes since previsit or office visit. 

## 2017-01-14 NOTE — Progress Notes (Signed)
A and O x3. Report to RN. Tolerated MAC anesthesia well.Teeth unchanged after procedure.

## 2017-01-14 NOTE — Patient Instructions (Signed)
Impression/recommendations:  [place] Ring Hiatal Hernia (handout given) Post Dilation diet (handout given)  YOU HAD AN ENDOSCOPIC PROCEDURE TODAY AT [place]:   Refer to the procedure report that was given to you for any specific questions about what was found during the examination.  If the procedure report does not answer your questions, please call your gastroenterologist to clarify.  If you requested that your care partner not be given the details of your procedure findings, then the procedure report has been included in a sealed envelope for you to review at your convenience later.  YOU SHOULD EXPECT: Some feelings of bloating in the abdomen. Passage of more gas than usual.  Walking can help get rid of the air that was put into your GI tract during the procedure and reduce the bloating. If you had a lower endoscopy (such as a colonoscopy or flexible sigmoidoscopy) you may notice spotting of blood in your stool or on the toilet paper. If you underwent a bowel prep for your procedure, you may not have a normal bowel movement for a few days.  Please Note:  You might notice some irritation and congestion in your nose or some drainage.  This is from the oxygen used during your procedure.  There is no need for concern and it should clear up in a day or so.  SYMPTOMS TO REPORT IMMEDIATELY:   Following upper endoscopy (EGD)  Vomiting of blood or coffee ground material  New chest pain or pain under the shoulder blades  Painful or persistently difficult swallowing  New shortness of breath  Fever of 100F or higher  Black, tarry-looking stools  For urgent or emergent issues, a gastroenterologist can be reached at any hour by calling [phone].   DIET:  We do recommend a small meal at first, but then you may proceed to your regular diet.  Drink plenty of fluids but you should avoid alcoholic beverages for 24 hours.  ACTIVITY:  You should plan to take it easy for the  rest of today and you should NOT DRIVE or use heavy machinery until tomorrow (because of the sedation medicines used during the test).    FOLLOW UP: Our staff will call the number listed on your records the next business day following your procedure to check on you and address any questions or concerns that you may have regarding the information given to you following your procedure. If we do not reach you, we will leave a message.  However, if you are feeling well and you are not experiencing any problems, there is no need to return our call.  We will assume that you have returned to your regular daily activities without incident.  If any biopsies were taken you will be contacted by phone or by letter within the next 1-3 weeks.  Please call us at [phone] if you have not heard about the biopsies in 3 weeks.    SIGNATURES/CONFIDENTIALITY: You and/or your care partner have signed paperwork which will be entered into your electronic medical record.  These signatures attest to the fact that that the information above on your After Visit Summary has been reviewed and is understood.  Full responsibility of the confidentiality of this discharge information lies with you and/or your care-partner.

## 2017-01-14 NOTE — Op Note (Signed)
[place] Patient Name: Emily Johns Procedure Date: 01/14/2017 2:13 PM MRN: [id number] Endoscopist: Jerene Bears , MD Age: 46 Referring MD:  Date of Birth: 1971/01/31 Gender: Female Account #: [id number] Procedure:                Upper GI endoscopy Indications:              Dysphagia (improved, but still present after                            dilation 1 month ago), Stricture of the esophagus,                            For therapy of esophageal stricture, EGD 1 month                            ago with dilation of distal esophageal stricture to                            13 mm Medicines:                Monitored Anesthesia Care Procedure:                Pre-Anesthesia Assessment:                           - Prior to the procedure, a History and Physical                            was performed, and patient medications and                            allergies were reviewed. The patient's tolerance of                            previous anesthesia was also reviewed. The risks                            and benefits of the procedure and the sedation                            options and risks were discussed with the patient.                            All questions were answered, and informed consent                            was obtained. Prior Anticoagulants: The patient has                            taken no previous anticoagulant or antiplatelet                            agents. ASA Grade Assessment: II - A patient with  mild systemic disease. After reviewing the risks                            and benefits, the patient was deemed in                            satisfactory condition to undergo the procedure.                           After obtaining informed consent, the endoscope was                            passed under direct vision. Throughout the                            procedure, the patient's blood pressure, pulse, and                       oxygen saturations were monitored continuously. The                            Endoscope was introduced through the mouth, and                            advanced to the second part of duodenum. The upper                            GI endoscopy was accomplished without difficulty.                            The patient tolerated the procedure well. Scope In: Scope Out: Findings:                 A low-grade of narrowing Schatzki ring (acquired)                            was found at the gastroesophageal junction (33 cm                            from incisors). The stricture was not as narrow as                            seen 1 month ago. The scope passed easily. A TTS                            dilator was passed through the scope. Dilation with                            a 13.5-14.5-15.5 mm balloon dilator was performed                            to 15.5 mm. The dilation site was examined and  showed moderate improvement in luminal narrowing                            with heme present.                           A large, 7 cm, hiatal hernia with a few superficial                            Cameron's erosions. [hiatal narrowing location].                            [z-line location].                           The exam of the stomach was otherwise normal.                           The examined duodenum was normal. Complications:            No immediate complications. Estimated Blood Loss:     Estimated blood loss was minimal. Impression:               - Low-grade of narrowing Schatzki ring. Dilated.                           - Large hiatal hernia.                           - Normal examined duodenum.                           - No specimens collected. Recommendation:           - Patient has a contact number available for                            emergencies. The signs and symptoms of potential                            delayed  complications were discussed with the                            patient. Return to normal activities tomorrow.                            Written discharge instructions were provided to the                            patient.                           - Resume previous diet.                           - Continue present medications.                           -  Repeat upper endoscopy as needed for retreatment.                           - Office follow-up next available. Jerene Bears, MD 01/14/2017 2:40:18 PM This report has been signed electronically.

## 2017-01-14 NOTE — Progress Notes (Signed)
Called to room to assist during endoscopic procedure.  Patient ID and intended procedure confirmed with present staff. Received instructions for my participation in the procedure from the performing physician.  

## 2017-01-15 ENCOUNTER — Telehealth: Payer: Self-pay

## 2017-01-15 NOTE — Telephone Encounter (Signed)
  Follow up Call-  Call back number 01/14/2017 12/10/2016 11/01/2014  Post procedure Call Back phone  # [phone]  Permission to leave phone message Yes Yes Yes  Some recent data might be hidden     Patient questions:  Do you have a fever, pain , or abdominal swelling? No. Pain Score  0 *  Have you tolerated food without any problems? Yes.    Have you been able to return to your normal activities? Yes.    Do you have any questions about your discharge instructions: Diet   No. Medications  No. Follow up visit  No.  Do you have questions or concerns about your Care? No.  Actions: * If pain score is 4 or above: No action needed, pain <4.  No problems noted per pt. maw

## 2017-03-20 ENCOUNTER — Ambulatory Visit: Payer: Federal, State, Local not specified - PPO | Admitting: Internal Medicine

## 2017-03-31 HISTORY — PX: HERNIA REPAIR: SHX51

## 2017-05-04 ENCOUNTER — Encounter: Payer: Self-pay | Admitting: *Deleted

## 2017-05-11 ENCOUNTER — Encounter: Payer: Self-pay | Admitting: Internal Medicine

## 2017-05-11 ENCOUNTER — Ambulatory Visit: Payer: Federal, State, Local not specified - PPO | Admitting: Internal Medicine

## 2017-05-11 VITALS — BP 106/76 | HR 100 | Ht 64.0 in | Wt 235.5 lb

## 2017-05-11 DIAGNOSIS — K449 Diaphragmatic hernia without obstruction or gangrene: Secondary | ICD-10-CM | POA: Diagnosis not present

## 2017-05-11 DIAGNOSIS — R1310 Dysphagia, unspecified: Secondary | ICD-10-CM

## 2017-05-11 DIAGNOSIS — K222 Esophageal obstruction: Secondary | ICD-10-CM

## 2017-05-11 DIAGNOSIS — K219 Gastro-esophageal reflux disease without esophagitis: Secondary | ICD-10-CM

## 2017-05-11 DIAGNOSIS — R1319 Other dysphagia: Secondary | ICD-10-CM

## 2017-05-11 MED ORDER — PANTOPRAZOLE SODIUM 40 MG PO TBEC: DELAYED_RELEASE_TABLET | ORAL | 6 refills | Status: DC

## 2017-05-11 NOTE — Progress Notes (Signed)
   Subjective:    Patient ID: Emily Johns, female    DOB: 01-29-1971, 48 y.o.   MRN: [id number]  HPI Emily Johns is a 47 year old female with a past medical history of GERD, esophageal stricture and large hiatal hernia who is here for follow-up.  She was seen in the office on 12/05/2016 for recurrent dysphagia by Tye Savoy, NP and then for upper endoscopy with dilation on 12/10/16 and 01/14/17.  She reports after the second dilation her dysphagia has improved significantly.  She has only had one instance where she felt that what she was eating did not transit normally into her stomach.  No odynophagia.  No heartburn on pantoprazole 40 mg daily.  No significant nausea or vomiting.  She will occasionally feel gurgling in her chest which she thinks relates to her hiatal hernia.  She has changed her diet and is trying to eat more salads and high fiber foods.  With dietary modification she is lost about 15 pounds which she is excited about.  She is interested in pursuing hiatal hernia repair.  EGD was performed for a second time on 01/14/2017.  This revealed a Schatzki's ring at the GE junction dilated to 15.5 with balloon.  This showed moderate improvement in luminal narrowing with scant heme.  A 7 cm hiatal hernia with superficial Cameron's erosions was seen.  There were no other stomach or proximal duodenal abnormalities.   Review of Systems As per HPI, otherwise negative  Current Medications, Allergies, Past Medical History, Past Surgical History, Family History and Social History were reviewed in [employer] record.     Objective:   Physical Exam BP 106/76   Pulse 100   Ht 5\' 4"  (1.626 m)   Wt 235 lb 8 oz (106.8 kg)   BMI 40.42 kg/m  Gen: awake, alert, NAD HEENT: anicteric, op clear CV: RRR, no mrg Pulm: CTA b/l Abd: soft, NT/ND, +BS throughout Ext: no c/c/e Neuro: nonfocal       Assessment & Plan:  47 year old female with a past medical history of  GERD, esophageal stricture and large hiatal hernia who is here for follow-up.  1.  Schatzki's ring with dysphagia --dysphagia has improved after dilation last in October 2018 to 15.5 mm.  For now she will continue her daily PPI medication with pantoprazole 40 mg once daily.  She will notify me when she develops any recurrent dysphagia and we will repeat dilation at that time.  I encouraged her not to wait for dysphagia to progress before calling for repeat dilation.  She voices understanding.  2.  Large hiatal hernia --history of large hiatal hernia leading to reflux and also Cameron's erosions.  She would like to have surgical opinion regarding possible hiatal hernia repair.  I will refer her to Dr. Hassell Done or Dr. Lucia Gaskins to consider hiatal hernia repair.  25 minutes spent with the patient today. Greater than 50% was spent in counseling and coordination of care with the patient

## 2017-05-11 NOTE — Patient Instructions (Signed)
We have sent the following medications to your pharmacy for you to pick up at your convenience: Pantoprazole   Please call our office if your dysphagia does not improve or if it worsens. You may need endoscopy.  You have been scheduled for an appointment with ____________ at [facility]. Your appointment is on ________________ at ____________. Please arrive at ________________ for registration. Make certain to bring a list of current medications, including any over the counter medications or vitamins. Also bring your co-pay if you have one as well as your insurance cards. [facility] is located at [address], [address]. Should you need to reschedule your appointment, please contact them at [phone].  If you are age 53 or older, your body mass index should be between 23-30. Your Body mass index is 40.42 kg/m. If this is out of the aforementioned range listed, please consider follow up with your Primary Care Provider.  If you are age 61 or younger, your body mass index should be between 19-25. Your Body mass index is 40.42 kg/m. If this is out of the aformentioned range listed, please consider follow up with your Primary Care Provider.

## 2017-05-12 DIAGNOSIS — N898 Other specified noninflammatory disorders of vagina: Secondary | ICD-10-CM | POA: Diagnosis not present

## 2017-05-12 DIAGNOSIS — Z1231 Encounter for screening mammogram for malignant neoplasm of breast: Secondary | ICD-10-CM | POA: Diagnosis not present

## 2017-05-12 DIAGNOSIS — Z1389 Encounter for screening for other disorder: Secondary | ICD-10-CM | POA: Diagnosis not present

## 2017-05-12 DIAGNOSIS — N760 Acute vaginitis: Secondary | ICD-10-CM | POA: Diagnosis not present

## 2017-05-12 DIAGNOSIS — Z130 Encounter for screening for diseases of the blood and blood-forming organs and certain disorders involving the immune mechanism: Secondary | ICD-10-CM | POA: Diagnosis not present

## 2017-05-12 DIAGNOSIS — Z6839 Body mass index (BMI) 39.0-39.9, adult: Secondary | ICD-10-CM | POA: Diagnosis not present

## 2017-05-12 DIAGNOSIS — Z01419 Encounter for gynecological examination (general) (routine) without abnormal findings: Secondary | ICD-10-CM | POA: Diagnosis not present

## 2017-05-18 ENCOUNTER — Telehealth: Payer: Self-pay | Admitting: Internal Medicine

## 2017-05-18 NOTE — Telephone Encounter (Signed)
Patient states Dr.Pyrtle was suppose to be referring her to [facility] and she has not heard anything from them of [place]. Pt wanting to check status of referral.

## 2017-05-18 NOTE — Telephone Encounter (Signed)
Yes, it was made on 05/12/17 at 8:54 am (11 pages faxed). I will follow up.

## 2017-05-18 NOTE — Telephone Encounter (Signed)
Dottie do you know if this referral was made to [facility]?

## 2017-05-18 NOTE — Telephone Encounter (Signed)
Per Sarah with [facility], patient has been contacted and scheduled for an appointment.

## 2017-05-18 NOTE — Telephone Encounter (Signed)
I have contacted [facility] who states that they do have records on patient and a record has been made. However, patient does not have an appointment in the system. I have left a voicemail for Judson Roch (new patient coordinator) to call back so she can schedule the appointment.

## 2017-05-26 DIAGNOSIS — R922 Inconclusive mammogram: Secondary | ICD-10-CM | POA: Diagnosis not present

## 2017-05-26 DIAGNOSIS — N6001 Solitary cyst of right breast: Secondary | ICD-10-CM | POA: Diagnosis not present

## 2017-05-28 DIAGNOSIS — K449 Diaphragmatic hernia without obstruction or gangrene: Secondary | ICD-10-CM | POA: Diagnosis not present

## 2017-05-28 DIAGNOSIS — Z9889 Other specified postprocedural states: Secondary | ICD-10-CM | POA: Diagnosis not present

## 2017-05-28 DIAGNOSIS — R112 Nausea with vomiting, unspecified: Secondary | ICD-10-CM | POA: Diagnosis not present

## 2017-06-03 ENCOUNTER — Ambulatory Visit: Payer: Self-pay | Admitting: General Surgery

## 2017-07-01 DIAGNOSIS — N760 Acute vaginitis: Secondary | ICD-10-CM | POA: Diagnosis not present

## 2017-07-13 NOTE — Patient Instructions (Addendum)
BLOSSOM CRUME  07/13/2017   Your procedure is scheduled on: 07-16-17   Report to [facility] Main  Entrance Report to Admitting at 5:30 AM    Call this number if you have problems the morning of surgery [phone]   Remember: NO SOLID FOOD AFTER MIDNIGHT THE NIGHT PRIOR TO SURGERY. NOTHING BY MOUTH EXCEPT CLEAR LIQUIDS UNTIL 3 HOURS PRIOR TO [place] SURGERY. PLEASE FINISH ENSURE DRINK PER SURGEON ORDER 3 HOURS PRIOR TO SCHEDULED SURGERY TIME WHICH NEEDS TO BE COMPLETED AT 4:30 .   CLEAR LIQUID DIET   Foods Allowed                                                                     Foods Excluded  Coffee and tea, regular and decaf                             liquids that you cannot  Plain Jell-O in any flavor                                             see through such as: Fruit ices (not with fruit pulp)                                     milk, soups, orange juice  Iced Popsicles                                    All solid food Carbonated beverages, regular and diet                                    Cranberry, grape and apple juices Sports drinks like Gatorade Lightly seasoned clear broth or consume(fat free) Sugar, honey syrup  Sample Menu Breakfast                                Lunch                                     Supper Cranberry juice                    Beef broth                            Chicken broth Jell-O                                     Grape juice  Apple juice Coffee or tea                        Jell-O                                      Popsicle                                                Coffee or tea                        Coffee or tea  _____________________________________________________________________    Take these medicines the morning of surgery with A SIP OF WATER: Pantoprazole (Protonix)                                You may not have any metal on your body including hair pins and          piercings  Do not wear jewelry, make-up, lotions, powders or perfumes, deodorant             Do not wear nail polish.  Do not shave  48 hours prior to surgery.                Do not bring valuables to the hospital. [place].  Contacts, dentures or bridgework may not be worn into surgery.  Leave suitcase in the car. After surgery it may be brought to your room.                 Please read over the following fact sheets you were given: _____________________________________________________________________         [facility] - Preparing for Surgery Before surgery, you can play an important role.  Because skin is not sterile, your skin needs to be as free of germs as possible.  You can reduce the number of germs on your skin by washing with CHG (chlorahexidine gluconate) soap before surgery.  CHG is an antiseptic cleaner which kills germs and bonds with the skin to continue killing germs even after washing. Please DO NOT use if you have an allergy to CHG or antibacterial soaps.  If your skin becomes reddened/irritated stop using the CHG and inform your nurse when you arrive at Short Stay. Do not shave (including legs and underarms) for at least 48 hours prior to the first CHG shower.  You may shave your face/neck. Please follow these instructions carefully:  1.  Shower with CHG Soap the night before surgery and the  morning of Surgery.  2.  If you choose to wash your hair, wash your hair first as usual with your  normal  shampoo.  3.  After you shampoo, rinse your hair and body thoroughly to remove the  shampoo.                           4.  Use CHG as you would any other liquid soap.  You can apply chg directly  to the skin and wash  Gently with a scrungie or clean washcloth.  5.  Apply the CHG Soap to your body ONLY FROM THE NECK DOWN.   Do not use on face/ open                           Wound or open sores. Avoid  contact with eyes, ears mouth and genitals (private parts).                       Wash face,  Genitals (private parts) with your normal soap.             6.  Wash thoroughly, paying special attention to the area where your surgery  will be performed.  7.  Thoroughly rinse your body with warm water from the neck down.  8.  DO NOT shower/wash with your normal soap after using and rinsing off  the CHG Soap.                9.  Pat yourself dry with a clean towel.            10.  Wear clean pajamas.            11.  Place clean sheets on your bed the night of your first shower and do not  sleep with pets. Day of Surgery : Do not apply any lotions/deodorants the morning of surgery.  Please wear clean clothes to the hospital/surgery center.  FAILURE TO FOLLOW THESE INSTRUCTIONS MAY RESULT IN THE CANCELLATION OF YOUR SURGERY PATIENT SIGNATURE_________________________________  NURSE SIGNATURE__________________________________  ________________________________________________________________________

## 2017-07-13 NOTE — Progress Notes (Addendum)
05-27-16 (Epic) ECHO  06-27-16 (Epic) LOV w/Luke Ugashik, PAC. Pt to return to see cardiology as needed.

## 2017-07-14 ENCOUNTER — Other Ambulatory Visit: Payer: Self-pay

## 2017-07-14 ENCOUNTER — Encounter (HOSPITAL_COMMUNITY)
Admission: RE | Admit: 2017-07-14 | Discharge: 2017-07-14 | Disposition: A | Discharge status: Home / Self Care | Payer: Federal, State, Local not specified - PPO | Source: Ambulatory Visit | Attending: General Surgery | Admitting: General Surgery

## 2017-07-14 ENCOUNTER — Encounter (HOSPITAL_COMMUNITY): Payer: Self-pay

## 2017-07-14 DIAGNOSIS — K449 Diaphragmatic hernia without obstruction or gangrene: Secondary | ICD-10-CM | POA: Diagnosis not present

## 2017-07-14 DIAGNOSIS — Z79899 Other long term (current) drug therapy: Secondary | ICD-10-CM | POA: Diagnosis not present

## 2017-07-14 DIAGNOSIS — J45909 Unspecified asthma, uncomplicated: Secondary | ICD-10-CM | POA: Diagnosis not present

## 2017-07-14 LAB — CBC WITH DIFFERENTIAL/PLATELET
Basophils Absolute: 0 10*3/uL (ref 0.0–0.1)
Basophils Relative: 0 %
Eosinophils Absolute: 0.2 10*3/uL (ref 0.0–0.7)
Eosinophils Relative: 3 %
HCT: 38.7 % (ref 36.0–46.0)
HEMOGLOBIN: 12.6 g/dL (ref 12.0–15.0)
LYMPHS ABS: 1.8 10*3/uL (ref 0.7–4.0)
LYMPHS PCT: 33 %
MCH: 30.4 pg (ref 26.0–34.0)
MCHC: 32.6 g/dL (ref 30.0–36.0)
MCV: 93.3 fL (ref 78.0–100.0)
Monocytes Absolute: 0.4 10*3/uL (ref 0.1–1.0)
Monocytes Relative: 7 %
NEUTROS PCT: 57 %
Neutro Abs: 3.2 10*3/uL (ref 1.7–7.7)
Platelets: 307 10*3/uL (ref 150–400)
RBC: 4.15 MIL/uL (ref 3.87–5.11)
RDW: 12.9 % (ref 11.5–15.5)
WBC: 5.5 10*3/uL (ref 4.0–10.5)

## 2017-07-14 LAB — BASIC METABOLIC PANEL
Anion gap: 8 (ref 5–15)
BUN: 8 mg/dL (ref 6–20)
CHLORIDE: 113 mmol/L — AB (ref 101–111)
CO2: 26 mmol/L (ref 22–32)
Calcium: 9.4 mg/dL (ref 8.9–10.3)
Creatinine, Ser: 0.64 mg/dL (ref 0.44–1.00)
GFR calc non Af Amer: >60 mL/min (ref >60)
Glucose, Bld: 82 mg/dL (ref 65–99)
POTASSIUM: 4.1 mmol/L (ref 3.5–5.1)
SODIUM: 147 mmol/L — AB (ref 135–145)

## 2017-07-15 MED ORDER — DEXTROSE 5 % IV SOLN
5.0000 mg/kg | INTRAVENOUS | Status: AC
  Administered 2017-07-16: 370 mg via INTRAVENOUS
  Filled 2017-07-15: qty 9.25

## 2017-07-15 MED ORDER — CLINDAMYCIN PHOSPHATE 900 MG/50ML IV SOLN
900.0000 mg | INTRAVENOUS | Status: AC
  Administered 2017-07-16: 900 mg via INTRAVENOUS
  Filled 2017-07-15: qty 50

## 2017-07-16 ENCOUNTER — Observation Stay (HOSPITAL_COMMUNITY)
Admission: RE | Admit: 2017-07-16 | Discharge: 2017-07-18 | Disposition: A | Discharge status: Home / Self Care | Payer: Federal, State, Local not specified - PPO | Source: Ambulatory Visit | Attending: General Surgery | Admitting: General Surgery

## 2017-07-16 ENCOUNTER — Encounter (HOSPITAL_COMMUNITY): Payer: Self-pay | Admitting: *Deleted

## 2017-07-16 ENCOUNTER — Ambulatory Visit (HOSPITAL_COMMUNITY): Payer: Federal, State, Local not specified - PPO | Admitting: Anesthesiology

## 2017-07-16 ENCOUNTER — Other Ambulatory Visit: Payer: Self-pay

## 2017-07-16 ENCOUNTER — Encounter (HOSPITAL_COMMUNITY)
Admission: RE | Disposition: A | Discharge status: Home / Self Care | Payer: Self-pay | Source: Ambulatory Visit | Attending: General Surgery

## 2017-07-16 DIAGNOSIS — J45909 Unspecified asthma, uncomplicated: Secondary | ICD-10-CM | POA: Insufficient documentation

## 2017-07-16 DIAGNOSIS — K449 Diaphragmatic hernia without obstruction or gangrene: Principal | ICD-10-CM

## 2017-07-16 DIAGNOSIS — Z79899 Other long term (current) drug therapy: Secondary | ICD-10-CM | POA: Diagnosis not present

## 2017-07-16 DIAGNOSIS — E785 Hyperlipidemia, unspecified: Secondary | ICD-10-CM | POA: Diagnosis not present

## 2017-07-16 DIAGNOSIS — J329 Chronic sinusitis, unspecified: Secondary | ICD-10-CM | POA: Diagnosis not present

## 2017-07-16 DIAGNOSIS — K5900 Constipation, unspecified: Secondary | ICD-10-CM | POA: Diagnosis not present

## 2017-07-16 HISTORY — PX: LAPAROSCOPIC NISSEN FUNDOPLICATION: SHX1932

## 2017-07-16 LAB — CBC
HCT: 36 % (ref 36.0–46.0)
Hemoglobin: 11.6 g/dL — ABNORMAL LOW (ref 12.0–15.0)
MCH: 29.8 pg (ref 26.0–34.0)
MCHC: 32.2 g/dL (ref 30.0–36.0)
MCV: 92.5 fL (ref 78.0–100.0)
PLATELETS: 261 10*3/uL (ref 150–400)
RBC: 3.89 MIL/uL (ref 3.87–5.11)
RDW: 12.7 % (ref 11.5–15.5)
WBC: 8.4 10*3/uL (ref 4.0–10.5)

## 2017-07-16 LAB — CREATININE, SERUM
CREATININE: 0.64 mg/dL (ref 0.44–1.00)
GFR calc non Af Amer: >60 mL/min (ref >60)

## 2017-07-16 SURGERY — FUNDOPLICATION, NISSEN, LAPAROSCOPIC
Anesthesia: General | Site: Abdomen

## 2017-07-16 MED ORDER — PANTOPRAZOLE SODIUM 40 MG PO TBEC
40.0000 mg | DELAYED_RELEASE_TABLET | Freq: Two times a day (BID) | ORAL | Status: DC
  Administered 2017-07-16 – 2017-07-17 (×3): 40 mg via ORAL
  Filled 2017-07-16 (×3): qty 1

## 2017-07-16 MED ORDER — SIMETHICONE 80 MG PO CHEW: 40.0000 mg | CHEWABLE_TABLET | Freq: Four times a day (QID) | ORAL | Status: DC | PRN

## 2017-07-16 MED ORDER — EPHEDRINE 5 MG/ML INJ
INTRAVENOUS | Status: AC
  Filled 2017-07-16: qty 10

## 2017-07-16 MED ORDER — SODIUM CHLORIDE 0.9 % IV SOLN
INTRAVENOUS | Status: DC
  Administered 2017-07-16: 13:00:00 via INTRAVENOUS

## 2017-07-16 MED ORDER — HEPARIN SODIUM (PORCINE) 5000 UNIT/ML IJ SOLN
5000.0000 [IU] | Freq: Once | INTRAMUSCULAR | Status: AC
  Administered 2017-07-16: 5000 [IU] via SUBCUTANEOUS
  Filled 2017-07-16: qty 1

## 2017-07-16 MED ORDER — FENTANYL CITRATE (PF) 250 MCG/5ML IJ SOLN
INTRAMUSCULAR | Status: AC
  Filled 2017-07-16: qty 5

## 2017-07-16 MED ORDER — FENTANYL CITRATE (PF) 250 MCG/5ML IJ SOLN
INTRAMUSCULAR | Status: DC | PRN
  Administered 2017-07-16 (×4): 50 ug via INTRAVENOUS

## 2017-07-16 MED ORDER — PROPOFOL 10 MG/ML IV BOLUS
INTRAVENOUS | Status: AC
  Filled 2017-07-16: qty 20

## 2017-07-16 MED ORDER — SUGAMMADEX SODIUM 500 MG/5ML IV SOLN
INTRAVENOUS | Status: DC | PRN
  Administered 2017-07-16: 300 mg via INTRAVENOUS

## 2017-07-16 MED ORDER — SCOPOLAMINE 1 MG/3DAYS TD PT72
MEDICATED_PATCH | TRANSDERMAL | Status: DC | PRN
  Administered 2017-07-16: 1 via TRANSDERMAL

## 2017-07-16 MED ORDER — DIPHENHYDRAMINE HCL 50 MG/ML IJ SOLN: 25.0000 mg | Freq: Four times a day (QID) | INTRAMUSCULAR | Status: DC | PRN

## 2017-07-16 MED ORDER — TRAMADOL HCL 50 MG PO TABS
50.0000 mg | ORAL_TABLET | Freq: Four times a day (QID) | ORAL | Status: DC | PRN
  Administered 2017-07-17: 50 mg via ORAL
  Filled 2017-07-16: qty 1

## 2017-07-16 MED ORDER — ESMOLOL HCL 100 MG/10ML IV SOLN
INTRAVENOUS | Status: DC | PRN
  Administered 2017-07-16: 20 mg via INTRAVENOUS

## 2017-07-16 MED ORDER — ONDANSETRON 4 MG PO TBDP: 4.0000 mg | ORAL_TABLET | Freq: Four times a day (QID) | ORAL | Status: DC | PRN

## 2017-07-16 MED ORDER — KETAMINE HCL 10 MG/ML IJ SOLN
INTRAMUSCULAR | Status: DC | PRN
  Administered 2017-07-16 (×2): 25 mg via INTRAVENOUS

## 2017-07-16 MED ORDER — ONDANSETRON HCL 4 MG/2ML IJ SOLN
INTRAMUSCULAR | Status: AC
  Filled 2017-07-16: qty 2

## 2017-07-16 MED ORDER — LIDOCAINE 2% (20 MG/ML) 5 ML SYRINGE
INTRAMUSCULAR | Status: DC | PRN
  Administered 2017-07-16: 80 mg via INTRAVENOUS

## 2017-07-16 MED ORDER — FENTANYL CITRATE (PF) 100 MCG/2ML IJ SOLN
INTRAMUSCULAR | Status: AC
  Administered 2017-07-16: 25 ug via INTRAVENOUS
  Filled 2017-07-16: qty 2

## 2017-07-16 MED ORDER — ENSURE PRE-SURGERY PO LIQD
296.0000 mL | Freq: Once | ORAL | Status: DC
  Filled 2017-07-16: qty 296

## 2017-07-16 MED ORDER — BUPIVACAINE HCL (PF) 0.25 % IJ SOLN
INTRAMUSCULAR | Status: AC
  Filled 2017-07-16: qty 30

## 2017-07-16 MED ORDER — ONDANSETRON HCL 4 MG/2ML IJ SOLN
INTRAMUSCULAR | Status: DC | PRN
  Administered 2017-07-16: 4 mg via INTRAVENOUS

## 2017-07-16 MED ORDER — GLYCOPYRROLATE 0.2 MG/ML IV SOSY
PREFILLED_SYRINGE | INTRAVENOUS | Status: AC
  Filled 2017-07-16: qty 5

## 2017-07-16 MED ORDER — 0.9 % SODIUM CHLORIDE (POUR BTL) OPTIME
TOPICAL | Status: DC | PRN
  Administered 2017-07-16: 1000 mL

## 2017-07-16 MED ORDER — EPHEDRINE SULFATE-NACL 50-0.9 MG/10ML-% IV SOSY
PREFILLED_SYRINGE | INTRAVENOUS | Status: DC | PRN
  Administered 2017-07-16: 10 mg via INTRAVENOUS
  Administered 2017-07-16 (×2): 5 mg via INTRAVENOUS

## 2017-07-16 MED ORDER — FENTANYL CITRATE (PF) 100 MCG/2ML IJ SOLN
25.0000 ug | INTRAMUSCULAR | Status: DC | PRN
  Administered 2017-07-16 (×2): 25 ug via INTRAVENOUS

## 2017-07-16 MED ORDER — ENOXAPARIN SODIUM 40 MG/0.4ML ~~LOC~~ SOLN
40.0000 mg | SUBCUTANEOUS | Status: DC
  Administered 2017-07-17: 40 mg via SUBCUTANEOUS
  Filled 2017-07-16: qty 0.4

## 2017-07-16 MED ORDER — LACTATED RINGERS IR SOLN
Status: DC | PRN
  Administered 2017-07-16: 1000 mL

## 2017-07-16 MED ORDER — ESMOLOL HCL 100 MG/10ML IV SOLN
INTRAVENOUS | Status: AC
  Filled 2017-07-16: qty 10

## 2017-07-16 MED ORDER — GABAPENTIN 300 MG PO CAPS
300.0000 mg | ORAL_CAPSULE | ORAL | Status: AC
  Administered 2017-07-16: 300 mg via ORAL
  Filled 2017-07-16: qty 1

## 2017-07-16 MED ORDER — DEXAMETHASONE SODIUM PHOSPHATE 10 MG/ML IJ SOLN
INTRAMUSCULAR | Status: DC | PRN
  Administered 2017-07-16: 10 mg via INTRAVENOUS

## 2017-07-16 MED ORDER — MIDAZOLAM HCL 2 MG/2ML IJ SOLN
INTRAMUSCULAR | Status: AC
  Filled 2017-07-16: qty 2

## 2017-07-16 MED ORDER — SUGAMMADEX SODIUM 500 MG/5ML IV SOLN
INTRAVENOUS | Status: AC
  Filled 2017-07-16: qty 5

## 2017-07-16 MED ORDER — LACTATED RINGERS IV SOLN
INTRAVENOUS | Status: DC
  Administered 2017-07-16: 07:00:00 via INTRAVENOUS

## 2017-07-16 MED ORDER — SUCCINYLCHOLINE CHLORIDE 200 MG/10ML IV SOSY
PREFILLED_SYRINGE | INTRAVENOUS | Status: DC | PRN
  Administered 2017-07-16: 100 mg via INTRAVENOUS

## 2017-07-16 MED ORDER — GLYCOPYRROLATE 0.2 MG/ML IJ SOLN
INTRAMUSCULAR | Status: DC | PRN
  Administered 2017-07-16: 0.2 mg via INTRAVENOUS

## 2017-07-16 MED ORDER — DIPHENHYDRAMINE HCL 25 MG PO CAPS: 25.0000 mg | ORAL_CAPSULE | Freq: Four times a day (QID) | ORAL | Status: DC | PRN

## 2017-07-16 MED ORDER — BUPIVACAINE HCL (PF) 0.25 % IJ SOLN
INTRAMUSCULAR | Status: DC | PRN
  Administered 2017-07-16: 30 mL

## 2017-07-16 MED ORDER — DEXAMETHASONE SODIUM PHOSPHATE 10 MG/ML IJ SOLN
INTRAMUSCULAR | Status: AC
  Filled 2017-07-16: qty 1

## 2017-07-16 MED ORDER — HYDRALAZINE HCL 20 MG/ML IJ SOLN: 10.0000 mg | INTRAMUSCULAR | Status: DC | PRN

## 2017-07-16 MED ORDER — LIDOCAINE 2% (20 MG/ML) 5 ML SYRINGE
INTRAMUSCULAR | Status: AC
  Filled 2017-07-16: qty 5

## 2017-07-16 MED ORDER — BUPIVACAINE LIPOSOME 1.3 % IJ SUSP
20.0000 mL | Freq: Once | INTRAMUSCULAR | Status: AC
  Administered 2017-07-16: 20 mL
  Filled 2017-07-16: qty 20

## 2017-07-16 MED ORDER — MORPHINE SULFATE (PF) 2 MG/ML IV SOLN
2.0000 mg | INTRAVENOUS | Status: DC | PRN
  Administered 2017-07-16 – 2017-07-17 (×3): 2 mg via INTRAVENOUS
  Filled 2017-07-16 (×3): qty 1

## 2017-07-16 MED ORDER — ONDANSETRON HCL 4 MG/2ML IJ SOLN
4.0000 mg | Freq: Four times a day (QID) | INTRAMUSCULAR | Status: DC | PRN
  Administered 2017-07-16 (×2): 4 mg via INTRAVENOUS
  Filled 2017-07-16 (×2): qty 2

## 2017-07-16 MED ORDER — HYDROCODONE-ACETAMINOPHEN 5-325 MG PO TABS: 1.0000 | ORAL_TABLET | ORAL | Status: DC | PRN

## 2017-07-16 MED ORDER — ENSURE ENLIVE PO LIQD
237.0000 mL | Freq: Two times a day (BID) | ORAL | Status: DC
  Administered 2017-07-17: 237 mL via ORAL

## 2017-07-16 MED ORDER — LIDOCAINE 2% (20 MG/ML) 5 ML SYRINGE
INTRAMUSCULAR | Status: AC
  Filled 2017-07-16: qty 10

## 2017-07-16 MED ORDER — ROCURONIUM BROMIDE 10 MG/ML (PF) SYRINGE
PREFILLED_SYRINGE | INTRAVENOUS | Status: DC | PRN
  Administered 2017-07-16: 10 mg via INTRAVENOUS
  Administered 2017-07-16: 50 mg via INTRAVENOUS

## 2017-07-16 MED ORDER — KETAMINE HCL 10 MG/ML IJ SOLN
INTRAMUSCULAR | Status: AC
  Filled 2017-07-16: qty 1

## 2017-07-16 MED ORDER — LIDOCAINE 2% (20 MG/ML) 5 ML SYRINGE
INTRAMUSCULAR | Status: DC | PRN
  Administered 2017-07-16: 1 mg/kg/h via INTRAVENOUS

## 2017-07-16 MED ORDER — ACETAMINOPHEN 500 MG PO TABS
1000.0000 mg | ORAL_TABLET | ORAL | Status: AC
  Administered 2017-07-16: 1000 mg via ORAL
  Filled 2017-07-16: qty 2

## 2017-07-16 MED ORDER — MIDAZOLAM HCL 2 MG/2ML IJ SOLN
INTRAMUSCULAR | Status: DC | PRN
  Administered 2017-07-16: 2 mg via INTRAVENOUS

## 2017-07-16 MED ORDER — PHENYLEPHRINE 40 MCG/ML (10ML) SYRINGE FOR IV PUSH (FOR BLOOD PRESSURE SUPPORT)
PREFILLED_SYRINGE | INTRAVENOUS | Status: DC | PRN
  Administered 2017-07-16 (×3): 80 ug via INTRAVENOUS

## 2017-07-16 MED ORDER — SCOPOLAMINE 1 MG/3DAYS TD PT72
MEDICATED_PATCH | TRANSDERMAL | Status: AC
  Filled 2017-07-16: qty 1

## 2017-07-16 MED ORDER — CHLORHEXIDINE GLUCONATE CLOTH 2 % EX PADS: 6.0000 | MEDICATED_PAD | Freq: Once | CUTANEOUS | Status: DC

## 2017-07-16 MED ORDER — ROCURONIUM BROMIDE 10 MG/ML (PF) SYRINGE
PREFILLED_SYRINGE | INTRAVENOUS | Status: AC
  Filled 2017-07-16: qty 5

## 2017-07-16 MED ORDER — PROPOFOL 10 MG/ML IV BOLUS
INTRAVENOUS | Status: DC | PRN
  Administered 2017-07-16: 200 mg via INTRAVENOUS

## 2017-07-16 MED ORDER — GABAPENTIN 300 MG PO CAPS
300.0000 mg | ORAL_CAPSULE | Freq: Two times a day (BID) | ORAL | Status: DC
  Administered 2017-07-16 – 2017-07-17 (×3): 300 mg via ORAL
  Filled 2017-07-16 (×3): qty 1

## 2017-07-16 SURGICAL SUPPLY — 47 items
APPLIER CLIP 5 13 M/L LIGAMAX5 (MISCELLANEOUS)
APPLIER CLIP ROT 10 11.4 M/L (STAPLE)
BANDAGE ADH SHEER 1  50/CT (GAUZE/BANDAGES/DRESSINGS) ×12 IMPLANT
BENZOIN TINCTURE PRP APPL 2/3 (GAUZE/BANDAGES/DRESSINGS) ×2 IMPLANT
CHLORAPREP W/TINT 26ML (MISCELLANEOUS) ×2 IMPLANT
CLIP APPLIE 5 13 M/L LIGAMAX5 (MISCELLANEOUS) IMPLANT
CLIP APPLIE ROT 10 11.4 M/L (STAPLE) IMPLANT
COVER SURGICAL LIGHT HANDLE (MISCELLANEOUS) ×2 IMPLANT
DECANTER SPIKE VIAL GLASS SM (MISCELLANEOUS) ×2 IMPLANT
DRAIN CHANNEL 19F RND (DRAIN) IMPLANT
DRAIN PENROSE 18X1/2 LTX STRL (DRAIN) ×2 IMPLANT
ELECT L-HOOK LAP 45CM DISP (ELECTROSURGICAL)
ELECT PENCIL ROCKER SW 15FT (MISCELLANEOUS) IMPLANT
ELECT REM PT RETURN 15FT ADLT (MISCELLANEOUS) ×2 IMPLANT
ELECTRODE L-HOOK LAP 45CM DISP (ELECTROSURGICAL) IMPLANT
EVACUATOR SILICONE 100CC (DRAIN) IMPLANT
GLOVE BIOGEL PI IND STRL 7.0 (GLOVE) ×1 IMPLANT
GLOVE BIOGEL PI INDICATOR 7.0 (GLOVE) ×1
GLOVE SURG SS PI 7.0 STRL IVOR (GLOVE) ×2 IMPLANT
GOWN STRL REUS W/TWL XL LVL3 (GOWN DISPOSABLE) ×6 IMPLANT
KIT BASIN OR (CUSTOM PROCEDURE TRAY) ×2 IMPLANT
L-HOOK LAP DISP 36CM (ELECTROSURGICAL)
LEGGING LITHOTOMY PAIR STRL (DRAPES) ×2 IMPLANT
LHOOK LAP DISP 36CM (ELECTROSURGICAL) IMPLANT
MARKER SKIN DUAL TIP RULER LAB (MISCELLANEOUS) ×2 IMPLANT
PAD POSITIONING PINK XL (MISCELLANEOUS) IMPLANT
POSITIONER SURGICAL ARM (MISCELLANEOUS) IMPLANT
SCISSORS LAP 5X45 EPIX DISP (ENDOMECHANICALS) ×2 IMPLANT
SET IRRIG TUBING LAPAROSCOPIC (IRRIGATION / IRRIGATOR) ×2 IMPLANT
SHEARS HARMONIC ACE PLUS 45CM (MISCELLANEOUS) ×2 IMPLANT
SLEEVE XCEL OPT CAN 5 100 (ENDOMECHANICALS) ×4 IMPLANT
STRIP CLOSURE SKIN 1/2X4 (GAUZE/BANDAGES/DRESSINGS) ×2 IMPLANT
SUT ETHIBOND 0 36 GRN (SUTURE) ×6 IMPLANT
SUT ETHILON 2 0 PS N (SUTURE) IMPLANT
SUT MNCRL AB 4-0 PS2 18 (SUTURE) ×2 IMPLANT
SUT SILK 0 SH 30 (SUTURE) IMPLANT
SUT SILK 2 0 SH (SUTURE) ×16 IMPLANT
TAPE CLOTH 4X10 WHT NS (GAUZE/BANDAGES/DRESSINGS) IMPLANT
TIP INNERVISION DETACH 40FR (MISCELLANEOUS) IMPLANT
TIP INNERVISION DETACH 50FR (MISCELLANEOUS) IMPLANT
TIP INNERVISION DETACH 56FR (MISCELLANEOUS) IMPLANT
TIPS INNERVISION DETACH 40FR (MISCELLANEOUS)
TOWEL OR 17X26 10 PK STRL BLUE (TOWEL DISPOSABLE) ×2 IMPLANT
TOWEL OR NON WOVEN STRL DISP B (DISPOSABLE) IMPLANT
TRAY LAPAROSCOPIC (CUSTOM PROCEDURE TRAY) ×2 IMPLANT
TROCAR BLADELESS OPT 5 100 (ENDOMECHANICALS) ×2 IMPLANT
TROCAR XCEL 12X100 BLDLESS (ENDOMECHANICALS) ×2 IMPLANT

## 2017-07-16 NOTE — Anesthesia Preprocedure Evaluation (Signed)
Anesthesia Evaluation  Patient identified by MRN, date of birth, ID band Patient awake    Reviewed: Allergy & Precautions, NPO status , Patient's Chart, lab work & pertinent test results  History of Anesthesia Complications (+) PONV  Airway Mallampati: II  TM Distance: >3 FB     Dental   Pulmonary asthma ,    breath sounds clear to auscultation       Cardiovascular negative cardio ROS   Rhythm:Regular Rate:Normal     Neuro/Psych    GI/Hepatic Neg liver ROS, hiatal hernia,   Endo/Other  negative endocrine ROS  Renal/GU negative Renal ROS     Musculoskeletal   Abdominal   Peds  Hematology   Anesthesia Other Findings   Reproductive/Obstetrics                             Anesthesia Physical Anesthesia Plan  ASA: III  Anesthesia Plan: General   Post-op Pain Management:    Induction: Intravenous  PONV Risk Score and Plan: Treatment may vary due to age or medical condition  Airway Management Planned: Oral ETT  Additional Equipment:   Intra-op Plan:   Post-operative Plan:   Informed Consent: I have reviewed the patients History and Physical, chart, labs and discussed the procedure including the risks, benefits and alternatives for the proposed anesthesia with the patient or authorized representative who has indicated his/her understanding and acceptance.   Dental advisory given  Plan Discussed with: CRNA and Anesthesiologist  Anesthesia Plan Comments:         Anesthesia Quick Evaluation

## 2017-07-16 NOTE — Op Note (Signed)
Preoperative diagnosis: type III hiatal hernia  Postoperative diagnosis: same   Procedure: laparoscopic hiatal hernia repair with Toupet fundoplication  Surgeon: Gurney Maxin, M.D.  Asst: Neysa Bonito, M.D.  Anesthesia: general  Indications for procedure: Emily Johns is a 47 y.o. year old female with symptoms of reflux and difficulty swallowing. Her workup showed a 7-8cm hiatal hernia with the GE junction in the chest.  Description of procedure: The patient was brought into the operative suite. Anesthesia was administered with General endotracheal anesthesia. WHO checklist was applied. The patient was then placed in supine position with split legs. The area was prepped and draped in the usual sterile fashion.  Small transverse incision was made in the left subcostal space and a 5 mm trocar was used to gain access to the peritoneum and optical entry technique.  Pneumoperitoneum was applied with a high flow and low pressure.  Laparoscope was reinserted to confirm placement.  Next, a supraumbilical incision was made and 5 mm trocar was placed through.  At this time Exparel and Marcaine TAP block was placed in the left and right as well as subxiphoid.  One 5 mm trocar was placed in the right upper quadrant.  The left subcostal trocar was upsized to an 11 mm trocar.  One 5 mm trocar was placed in the left lateral space.  Xiphoid incision was made and Nathanson retractor used to retract the left lobe of the liver.  Hiatal hernia was immediately visible.  We began dissection by taking down the pars flaccida with harmonic scalpel.  We continued this anteriorly to dissect the hiatal hernia sac away from the right crus and anterior crural edge.  Continued across the left.  Blunt dissection was then used to remove the anterior portion of the sac from the chest and free up surrounding esophagus.  Next, posterior hernia sac was identified and the posterior right crus and left crus were identified hernia sac  was removed with harmonic scalpel to allow good apposition of the right and left posterior crus.  Next, attention was turned towards the short gastrics and these were divided in the upper third of the stomach using Harmonic scalpel and continued onto the left crus joining previous dissection of the hernia sac.  This allowed 360 degrees of freedom of the hernia sac and the esophagus from the crura.  The dissection of the esophagus and the chest was used to gain additional length of the esophagus to ensure greater than 3 cm of abdominal length.  The sac was removed with harmonic scalpel.  Care was taken to identify both the anterior and posterior vagus and not divide them.  Next, the right and left crura were opposed with 3-0 Ethibond sutures in interrupted fashion.  33 French bougie was then placed down the mouth into the stomach.  The esophagus appeared to fit snugly around the hiatus with 54 French bougie in place.  Next Toupet plication was performed with 3 2-0 silk suturing the posterior fundus around to the right esophagus and 3 2-0 silk suturing the anterior fundus to the left esophagus in interrupted fashion.  This was done over the 54 French bougie.  Once those sutures were in place 54 French bougie was removed.  The posterior fundus was then sutured to the right crus with one interrupted 2-0 silk.  The lateral fundus was then sutured to the left crus using a 2-0 silk in interrupted fashion.  Hemostasis appeared intact.  Nathanson retractor was removed.  The peritoneum was evacuated.  Trochars were removed.  All incisions were closed with 4-0 Monocryl subcuticular stitch.  Findings: hiatal hernia, appropriate sized repair around a 54Fr bougie, Toupet in appropriate posterior orientation with >3cm of esophagus in the abdomen  Specimen: none  Implant: none   Blood loss: <59ml  Local anesthesia: 50 ml Exparel:Marcaine Mix  Complications: none  Gurney Maxin, M.D. General, Bariatric, &  Minimally Invasive Surgery [facility]

## 2017-07-16 NOTE — Anesthesia Postprocedure Evaluation (Signed)
Anesthesia Post Note  Patient: Emily Johns  Procedure(s) Performed: LAPAROSCOPIC hiatal hernia repair with fundoplication (N/A Abdomen)     Patient location during evaluation: PACU Anesthesia Type: General Level of consciousness: awake Pain management: pain level controlled Vital Signs Assessment: post-procedure vital signs reviewed and stable Respiratory status: spontaneous breathing Cardiovascular status: stable Anesthetic complications: no    Last Vitals:  Vitals:   07/16/17 1055 07/16/17 1109  BP: 134/85 126/81  Pulse: 81 74  Resp: 20 16  Temp: 36.6 C 36.5 C  SpO2: 100% 98%    Last Pain:  Vitals:   07/16/17 1144  TempSrc:   PainSc: 6                  Aarushi Hemric

## 2017-07-16 NOTE — Anesthesia Procedure Notes (Signed)
Procedure Name: Intubation Date/Time: 07/16/2017 7:34 AM Performed by: Victoriano Lain, CRNA Pre-anesthesia Checklist: Patient identified, Emergency Drugs available, Suction available, Patient being monitored and Timeout performed Patient Re-evaluated:Patient Re-evaluated prior to induction Oxygen Delivery Method: Circle system utilized Preoxygenation: Pre-oxygenation with 100% oxygen Induction Type: IV induction Ventilation: Mask ventilation without difficulty Laryngoscope Size: Mac and 4 Grade View: Grade I Tube type: Oral Tube size: 7.5 mm Airway Equipment and Method: Stylet Placement Confirmation: ETT inserted through vocal cords under direct vision,  positive ETCO2 and breath sounds checked- equal and bilateral Secured at: 22 cm Tube secured with: Tape Dental Injury: Teeth and Oropharynx as per pre-operative assessment

## 2017-07-16 NOTE — Transfer of Care (Signed)
Immediate Anesthesia Transfer of Care Note  Patient: Emily Johns  Procedure(s) Performed: LAPAROSCOPIC hiatal hernia repair with fundoplication (N/A Abdomen)  Patient Location: PACU  Anesthesia Type:General  Level of Consciousness: awake, alert , oriented and patient cooperative  Airway & Oxygen Therapy: Patient Spontanous Breathing and Patient connected to face mask oxygen  Post-op Assessment: Report given to RN, Post -op Vital signs reviewed and stable and Patient moving all extremities  Post vital signs: Reviewed and stable  Last Vitals:  Vitals Value Taken Time  BP 139/87 07/16/2017 10:10 AM  Temp    Pulse 98 07/16/2017 10:10 AM  Resp 24 07/16/2017 10:10 AM  SpO2 100 % 07/16/2017 10:10 AM  Vitals shown include unvalidated device data.  Last Pain:  Vitals:   07/16/17 0552  TempSrc: Oral  PainSc: 4          Complications: No apparent anesthesia complications

## 2017-07-16 NOTE — H&P (Signed)
Emily Johns is an 47 y.o. female.   Chief Complaint: hiatal hernia HPI: 47 yo female with reflux symptoms and some symptoms with swallowing. On workup she was found to have a moderate sized type III hiatal hernia.  Past Medical History:  Diagnosis Date  . Arthritis   . Asthma   . Hiatal hernia   . Hyperlipemia   . PONV (postoperative nausea and vomiting)   . Schatzki's ring   . Vitamin D deficiency     Past Surgical History:  Procedure Laterality Date  . ABDOMINAL HYSTERECTOMY     partial  . APPENDECTOMY    . CESAREAN SECTION     x3  . CHOLECYSTECTOMY    . EXPLORATORY LAPAROTOMY    . LAPAROSCOPIC LYSIS OF ADHESIONS  05/06/2012   Procedure: LAPAROSCOPIC LYSIS OF ADHESIONS;  Surgeon: Cheri Fowler, MD;  Location: [facility];  Service: Gynecology;  Laterality: N/A;  . LAPAROSCOPY  05/06/2012   Procedure: LAPAROSCOPY OPERATIVE;  Surgeon: Cheri Fowler, MD;  Location: [facility];  Service: Gynecology;  Laterality: N/A;  . SALPINGOOPHORECTOMY  05/06/2012   Procedure: SALPINGO OOPHORECTOMY;  Surgeon: Cheri Fowler, MD;  Location: [facility];  Service: Gynecology;  Laterality: Left;    Family History  Problem Relation Age of Onset  . Hyperlipidemia Father   . Hypertension Father   . Hypertension Sister   . Heart disease Paternal Grandfather   . Breast cancer Neg Hx   . Stomach cancer Neg Hx   . Esophageal cancer Neg Hx   . Colon cancer Neg Hx    Social History:  reports that she has never smoked. She has never used smokeless tobacco. She reports that she does not drink alcohol or use drugs.  Allergies:  Allergies  Allergen Reactions  . Penicillins Anaphylaxis, Swelling and Rash    Has patient had a PCN reaction causing immediate rash, facial/tongue/throat swelling, SOB or lightheadedness with hypotension: Yes Has patient had a PCN reaction causing severe rash involving mucus membranes or skin necrosis: Unknown Has patient had a PCN reaction that required hospitalization: No Has patient  had a PCN reaction occurring within the last 10 years: Yes If all of the above answers are "NO", then may proceed with Cephalosporin use.   . Shellfish Allergy Shortness Of Breath    Selling, throat closes  . Iodinated Diagnostic Agents Other (See Comments)    Iodine, betadine--due to shellfish allergy   . Levaquin [Levofloxacin In D5w] Other (See Comments)    Patient cannot remember reaction.  . Lodine [Etodolac] Palpitations    Medications Prior to Admission  Medication Sig Dispense Refill  . clindamycin (CLEOCIN) 300 MG capsule Take 300 mg by mouth 3 (three) times daily.  0  . FIBER PO Take 1-2 tablets by mouth daily.     Marland Kitchen ibuprofen (ADVIL,MOTRIN) 200 MG tablet Take 400-600 mg by mouth every 8 (eight) hours as needed (for pain.).     Marland Kitchen pantoprazole (PROTONIX) 40 MG tablet 40 mg daily -30 minutes before first meal of the day (Patient taking differently: Take 40 mg by mouth daily before breakfast. 40 mg daily -30 minutes before first meal of the day) 30 tablet 6  . albuterol (PROVENTIL HFA;VENTOLIN HFA) 108 (90 BASE) MCG/ACT inhaler Inhale 2 puffs into the lungs every 6 (six) hours as needed for wheezing or shortness of breath. 1 Inhaler 2  . hydrocortisone (ANUSOL-HC) 25 MG suppository Use 1 suppository at bedtime. (Patient not taking: Reported on 01/14/2017) 10 suppository 2  Results for orders placed or performed during the hospital encounter of 07/14/17 (from the past 48 hour(s))  Basic metabolic panel     Status: Abnormal   Collection Time: 07/14/17  2:51 PM  Result Value Ref Range   Sodium 147 (H) 135 - 145 mmol/L   Potassium 4.1 3.5 - 5.1 mmol/L   Chloride 113 (H) 101 - 111 mmol/L   CO2 26 22 - 32 mmol/L   Glucose, Bld 82 65 - 99 mg/dL   BUN 8 6 - 20 mg/dL   Creatinine, Ser 0.64 0.44 - 1.00 mg/dL   Calcium 9.4 8.9 - 10.3 mg/dL   GFR calc non Af Amer >60 >60 mL/min   GFR calc Af Amer >60 >60 mL/min    Comment: (NOTE) The eGFR has been calculated using the CKD EPI  equation. This calculation has not been validated in all clinical situations. eGFR's persistently <60 mL/min signify possible Chronic Kidney Disease.    Anion gap 8 5 - 15    Comment: Performed at [facility], [place] [address], [place], [place] [address]  CBC WITH DIFFERENTIAL     Status: None   Collection Time: 07/14/17  2:51 PM  Result Value Ref Range   WBC 5.5 4.0 - 10.5 K/uL   RBC 4.15 3.87 - 5.11 MIL/uL   Hemoglobin 12.6 12.0 - 15.0 g/dL   HCT 38.7 36.0 - 46.0 %   MCV 93.3 78.0 - 100.0 fL   MCH 30.4 26.0 - 34.0 pg   MCHC 32.6 30.0 - 36.0 g/dL   RDW 12.9 11.5 - 15.5 %   Platelets 307 150 - 400 K/uL   Neutrophils Relative % 57 %   Neutro Abs 3.2 1.7 - 7.7 K/uL   Lymphocytes Relative 33 %   Lymphs Abs 1.8 0.7 - 4.0 K/uL   Monocytes Relative 7 %   Monocytes Absolute 0.4 0.1 - 1.0 K/uL   Eosinophils Relative 3 %   Eosinophils Absolute 0.2 0.0 - 0.7 K/uL   Basophils Relative 0 %   Basophils Absolute 0.0 0.0 - 0.1 K/uL    Comment: Performed at [facility], [place] [address] [place], [place] [address]   No results found.  Review of Systems  Constitutional: Negative for chills and fever.  HENT: Negative for hearing loss.   Eyes: Negative for blurred vision and double vision.  Respiratory: Negative for cough and hemoptysis.   Cardiovascular: Negative for chest pain and palpitations.  Gastrointestinal: Negative for abdominal pain, nausea and vomiting.  Genitourinary: Negative for dysuria and urgency.  Musculoskeletal: Negative for myalgias and neck pain.  Skin: Negative for itching and rash.  Neurological: Negative for dizziness, tingling and headaches.  Endo/Heme/Allergies: Does not bruise/bleed easily.  Psychiatric/Behavioral: Negative for depression and suicidal ideas.    Blood pressure 112/69, pulse 72, temperature 97.9 F (36.6 C), temperature source Oral, resp. rate 16, height _0  (1.651 m), weight 101.2 kg (223 lb), SpO2 98  %. Physical Exam  Vitals reviewed. Constitutional: She is oriented to person, place, and time. She appears well-developed and well-nourished.  HENT:  Head: Normocephalic and atraumatic.  Eyes: Pupils are equal, round, and reactive to light. Conjunctivae and EOM are normal.  Neck: Normal range of motion. Neck supple.  Cardiovascular: Normal rate and regular rhythm.  Respiratory: Effort normal and breath sounds normal.  GI: Soft. Bowel sounds are normal. She exhibits no distension. There is no tenderness.  Musculoskeletal: Normal range of motion.  Neurological: She is alert  and oriented to person, place, and time.  Skin: Skin is warm and dry.  Psychiatric: She has a normal mood and affect. Her behavior is normal.     Assessment/Plan 47 yo female with type III hiatal hernia -lap hiatal hernia repair with fundoplication -planned observation -enhanced recovery protocol  Mickeal Skinner, MD 07/16/2017, 7:17 AM

## 2017-07-17 ENCOUNTER — Encounter (HOSPITAL_COMMUNITY): Payer: Self-pay | Admitting: General Surgery

## 2017-07-17 DIAGNOSIS — J45909 Unspecified asthma, uncomplicated: Secondary | ICD-10-CM | POA: Diagnosis not present

## 2017-07-17 DIAGNOSIS — K449 Diaphragmatic hernia without obstruction or gangrene: Secondary | ICD-10-CM | POA: Diagnosis not present

## 2017-07-17 DIAGNOSIS — Z79899 Other long term (current) drug therapy: Secondary | ICD-10-CM | POA: Diagnosis not present

## 2017-07-17 LAB — BASIC METABOLIC PANEL
Anion gap: 7 (ref 5–15)
BUN: 8 mg/dL (ref 6–20)
CHLORIDE: 109 mmol/L (ref 101–111)
CO2: 23 mmol/L (ref 22–32)
CREATININE: 0.53 mg/dL (ref 0.44–1.00)
Calcium: 8.8 mg/dL — ABNORMAL LOW (ref 8.9–10.3)
GFR calc non Af Amer: >60 mL/min (ref >60)
GLUCOSE: 98 mg/dL (ref 65–99)
Potassium: 4 mmol/L (ref 3.5–5.1)
Sodium: 139 mmol/L (ref 135–145)

## 2017-07-17 LAB — CBC
HCT: 34.9 % — ABNORMAL LOW (ref 36.0–46.0)
HEMOGLOBIN: 11.5 g/dL — AB (ref 12.0–15.0)
MCH: 30.7 pg (ref 26.0–34.0)
MCHC: 33 g/dL (ref 30.0–36.0)
MCV: 93.3 fL (ref 78.0–100.0)
PLATELETS: 283 10*3/uL (ref 150–400)
RBC: 3.74 MIL/uL — AB (ref 3.87–5.11)
RDW: 13 % (ref 11.5–15.5)
WBC: 7.5 10*3/uL (ref 4.0–10.5)

## 2017-07-17 MED ORDER — ONDANSETRON 4 MG PO TBDP: 4.0000 mg | ORAL_TABLET | Freq: Four times a day (QID) | ORAL | 0 refills | Status: DC | PRN

## 2017-07-17 MED ORDER — MORPHINE SULFATE (PF) 2 MG/ML IV SOLN: 2.0000 mg | INTRAVENOUS | Status: DC | PRN

## 2017-07-17 MED ORDER — HYDROCODONE-ACETAMINOPHEN 5-325 MG PO TABS: 1.0000 | ORAL_TABLET | Freq: Four times a day (QID) | ORAL | 0 refills | Status: DC | PRN

## 2017-07-17 MED ORDER — GABAPENTIN 300 MG PO CAPS: 300.0000 mg | ORAL_CAPSULE | Freq: Two times a day (BID) | ORAL | 0 refills | Status: DC

## 2017-07-17 NOTE — Discharge Instructions (Signed)
[facility] ______[facility] LAPAROSCOPIC SURGERY: POST OP INSTRUCTIONS Always review your discharge instruction sheet given to you by the facility where your surgery was performed. IF YOU HAVE DISABILITY OR FAMILY LEAVE FORMS, YOU MUST BRING THEM TO THE OFFICE FOR PROCESSING.   DO NOT GIVE THEM TO YOUR DOCTOR.  1. A prescription for pain medication may be given to you upon discharge.  Take your pain medication as prescribed, if needed.  If narcotic pain medicine is not needed, then you may take acetaminophen (Tylenol) or ibuprofen (Advil) as needed. 2. Take your usually prescribed medications unless otherwise directed. 3. If you need a refill on your pain medication, please contact your pharmacy.  They will contact our office to request authorization. Prescriptions will not be filled after 5pm or on week-ends. 4. You should follow a liquid diet for at least 1 week or as instructed by Dr. Kieth Brightly.  Introduce soft foods first like scrambled eggs or mashed potatoes before harder foods like steak or tough breads or very fibrous vegetables.  Be sure to include lots of fluids daily. 5. Most patients will experience some swelling and bruising in the area of the incisions.  Ice packs will help.  Swelling and bruising can take several days to resolve.  6. It is common to experience some constipation if taking pain medication after surgery.  Increasing fluid intake and taking a stool softener (such as Colace) will usually help or prevent this problem from occurring.  A mild laxative (Milk of Magnesia or Miralax) should be taken according to package instructions if there are no bowel movements after 48 hours. 7. Unless discharge instructions indicate otherwise, you may remove your bandages 24-48 hours after surgery, and you may shower at that time.  You may have steri-strips (small skin tapes) in place directly over the incision.  These strips should be left on the skin for 7-10 days.  If your  surgeon used skin glue on the incision, you may shower in 24 hours.  The glue will flake off over the next 2-3 weeks.  Any sutures or staples will be removed at the office during your follow-up visit. 8. ACTIVITIES:  You may resume regular (light) daily activities beginning the next day--such as daily self-care, walking, climbing stairs--gradually increasing activities as tolerated.  You may have sexual intercourse when it is comfortable.  Refrain from any heavy lifting or straining until approved by your doctor. a. You may drive when you are no longer taking prescription pain medication, you can comfortably wear a seatbelt, and you can safely maneuver your car and apply brakes. b. RETURN TO WORK:  __________________________________________________________ 9. You should see your doctor in the office for a follow-up appointment approximately 2-3 weeks after your surgery.  Make sure that you call for this appointment within a day or two after you arrive home to insure a convenient appointment time. 10. OTHER INSTRUCTIONS: __________________________________________________________________________________________________________________________ __________________________________________________________________________________________________________________________ WHEN TO CALL YOUR DOCTOR: 1. Fever over 101.0 2. Inability to urinate 3. Continued bleeding from incision. 4. Increased pain, redness, or drainage from the incision. 5. Increasing abdominal pain  The clinic staff is available to answer your questions during regular business hours.  Please dont hesitate to call and ask to speak to one of the nurses for clinical concerns.  If you have a medical emergency, go to the nearest emergency room or call 911.  A surgeon from [facility] is always on call at the hospital. [address], [place] [employer], [place], [place]  [address] ? [address], [place], [place]   [address] [phone] ?  [phone] ? FAX [phone] Web site: [email or web address]

## 2017-07-17 NOTE — Progress Notes (Signed)
1 Day Post-Op   Subjective/Chief Complaint: Pretty sore, but no n/v this AM.  Still has foley.  Passing some gas.  Has done some incentive spirometry.   Objective: Vital signs in last 24 hours: Temp:  [97.7 F (36.5 C)-98.7 F (37.1 C)] 98.7 F (37.1 C) (04/19 0553) Pulse Rate:  [49-97] 49 (04/19 0553) Resp:  [14-21] 14 (04/19 0553) BP: (96-139)/(56-89) 105/71 (04/19 0553) SpO2:  [96 %-100 %] 97 % (04/19 0553) Last BM Date: 07/15/17  Intake/Output from previous day: 04/18 0701 - 04/19 0700 In: 2729.3 [P.O.:150; I.V.:2420; IV Piggyback:159.3] Out: 2065 [Urine:2050; Blood:15] Intake/Output this shift: No intake/output data recorded.  General appearance: alert, cooperative and no distress Resp: breathing comfortably GI: soft, non distended.  wounds c/d/i.  approp tender at incisions.    Lab Results:  Recent Labs    07/16/17 1154 07/17/17 0502  WBC 8.4 7.5  HGB 11.6* 11.5*  HCT 36.0 34.9*  PLT 261 283   BMET Recent Labs    07/14/17 1451 07/16/17 1154 07/17/17 0502  NA 147*  --  139  K 4.1  --  4.0  CL 113*  --  109  CO2 26  --  23  GLUCOSE 82  --  98  BUN 8  --  8  CREATININE 0.64 0.64 0.53  CALCIUM 9.4  --  8.8*   PT/INR No results for input(s): LABPROT, INR in the last 72 hours. ABG No results for input(s): PHART, HCO3 in the last 72 hours.  Invalid input(s): PCO2, PO2  Studies/Results: No results found.  Anti-infectives: Anti-infectives (From admission, onward)   Start     Dose/Rate Route Frequency Ordered Stop   07/16/17 0600  clindamycin (CLEOCIN) IVPB 900 mg     900 mg 100 mL/hr over 30 Minutes Intravenous On call to O.R. 07/15/17 1122 07/16/17 0804   07/16/17 0600  gentamicin (GARAMYCIN) 370 mg in dextrose 5 % 100 mL IVPB     5 mg/kg  74.7 kg (Adjusted) 109.3 mL/hr over 60 Minutes Intravenous On call to O.R. 07/15/17 1122 07/16/17 0852      Assessment/Plan: s/p Procedure(s): LAPAROSCOPIC hiatal hernia repair with fundoplication  (N/A) d/c foley saline lock ivf  Possibly home later today if improves mobilization, able to urinate spontaneously, and if oral pain meds are tolerable.   May need another day given soreness and lack of mobility. Reinforced IS.   LOS: 0 days    Stark Klein 07/17/2017

## 2017-07-18 DIAGNOSIS — J45909 Unspecified asthma, uncomplicated: Secondary | ICD-10-CM | POA: Diagnosis not present

## 2017-07-18 DIAGNOSIS — K449 Diaphragmatic hernia without obstruction or gangrene: Secondary | ICD-10-CM | POA: Diagnosis not present

## 2017-07-18 DIAGNOSIS — Z79899 Other long term (current) drug therapy: Secondary | ICD-10-CM | POA: Diagnosis not present

## 2017-07-18 NOTE — Progress Notes (Signed)
Assessment unchanged. Pt and husband verbalized understanding of dc instructions through teach back including follow up care and when to call the MD. No scripts. Discharged via wc to front entrance accompanied by NT and family.

## 2017-07-18 NOTE — Discharge Summary (Signed)
Physician Discharge Summary  Patient ID: Emily Johns MRN: [id number] DOB/AGE: 10/11/1970 47 y.o.  Admit date: 07/16/2017 Discharge date: 07/18/2017  Admission Diagnoses: Patient Active Problem List   Diagnosis Date Noted  . Hiatal hernia 07/16/2017  . Constipation 08/20/2016  . Change in bowel habits 08/20/2016  . Obesity (BMI 30-39.9) 06/27/2016  . Palpitations 05/14/2016  . Chest pain 05/14/2016  . Acute bronchitis 03/17/2016  . Acute non-recurrent sinusitis 03/17/2016  . Bilateral temporomandibular joint pain 06/07/2015  . Chronic sinusitis 06/07/2015  . Rhinitis, chronic 06/07/2015  . Hyperlipidemia 07/12/2014  . Vitamin D deficiency 07/12/2014  . Pelvic adhesions 05/06/2012    Discharge Diagnoses:  Active Problems:   Hiatal hernia same  Discharged Condition: stable  Hospital Course: Pt was admitted to the floor following laparoscopic hiatal hernia repair.  She had some nausea initially.  She was also quite sore requiring IV pain meds on POD 0-1.  Her IV fluids were stopped and she was encouraged to try oral pain meds.  She was much more mobile and comfortable on POD2.  She was able to tolerate liquids without issue.  She was able to void independently after foley removal.  She was discharged to home in stable condition.    Consults: None  Significant Diagnostic Studies: labs: see epic for full details.  WBCs 7.5, HCT 34.9, Cr 0.53, K 4 prior to d/c.    Treatments: surgery: see above.  Discharge Exam: Blood pressure (!) 104/57, pulse 62, temperature 98.9 F (37.2 C), temperature source Oral, resp. rate 17, height 5\' 5"  (1.651 m), weight 101.2 kg (223 lb), SpO2 96 %. General appearance: alert, cooperative and no distress Resp: breathing comfortably GI: soft, non distended, non tender.  dressing c/d/i.  Disposition: Discharge disposition: 01-Home or Self Care       Discharge Instructions    Call MD for:  persistant nausea and vomiting   Complete by:  As  directed    Call MD for:  redness, tenderness, or signs of infection (pain, swelling, redness, odor or green/yellow discharge around incision site)   Complete by:  As directed    Call MD for:  severe uncontrolled pain   Complete by:  As directed    Call MD for:  temperature >100.4   Complete by:  As directed    Diet - low sodium heart healthy   Complete by:  As directed    Increase activity slowly   Complete by:  As directed    Increase activity slowly   Complete by:  As directed      Allergies as of 07/18/2017      Reactions   Penicillins Anaphylaxis, Swelling, Rash   Has patient had a PCN reaction causing immediate rash, facial/tongue/throat swelling, SOB or lightheadedness with hypotension: Yes Has patient had a PCN reaction causing severe rash involving mucus membranes or skin necrosis: Unknown Has patient had a PCN reaction that required hospitalization: No Has patient had a PCN reaction occurring within the last 10 years: Yes If all of the above answers are "NO", then may proceed with Cephalosporin use.   Shellfish Allergy Shortness Of Breath   Selling, throat closes   Iodinated Diagnostic Agents Other (See Comments)   Iodine, betadine--due to shellfish allergy    Levaquin [levofloxacin In D5w] Other (See Comments)   Patient cannot remember reaction.   Lodine [etodolac] Palpitations      Medication List    STOP taking these medications   clindamycin 300 MG capsule Commonly known  as:  CLEOCIN     TAKE these medications   albuterol 108 (90 Base) MCG/ACT inhaler Commonly known as:  PROVENTIL HFA;VENTOLIN HFA Inhale 2 puffs into the lungs every 6 (six) hours as needed for wheezing or shortness of breath.   FIBER PO Take 1-2 tablets by mouth daily.   gabapentin 300 MG capsule Commonly known as:  NEURONTIN Take 1 capsule (300 mg total) by mouth 2 (two) times daily.   HYDROcodone-acetaminophen 5-325 MG tablet Commonly known as:  NORCO/VICODIN Take 1-2 tablets by  mouth every 6 (six) hours as needed for moderate pain.   hydrocortisone 25 MG suppository Commonly known as:  ANUSOL-HC Use 1 suppository at bedtime.   ibuprofen 200 MG tablet Commonly known as:  ADVIL,MOTRIN Take 400-600 mg by mouth every 8 (eight) hours as needed (for pain.).   ondansetron 4 MG disintegrating tablet Commonly known as:  ZOFRAN-ODT Take 1 tablet (4 mg total) by mouth every 6 (six) hours as needed for nausea.   pantoprazole 40 MG tablet Commonly known as:  PROTONIX 40 mg daily -30 minutes before first meal of the day What changed:    how much to take  how to take this  when to take this  additional instructions      Follow-up Information    Kinsinger, Arta Bruce, MD Follow up in 2 week(s).   Specialty:  General Surgery Contact information: [address] [place] [address] [phone]           Signed: Stark Klein 07/18/2017, 8:24 AM

## 2017-12-21 DIAGNOSIS — H10239 Serous conjunctivitis, except viral, unspecified eye: Secondary | ICD-10-CM | POA: Diagnosis not present

## 2017-12-21 DIAGNOSIS — Z6834 Body mass index (BMI) 34.0-34.9, adult: Secondary | ICD-10-CM | POA: Diagnosis not present

## 2017-12-21 DIAGNOSIS — H6982 Other specified disorders of Eustachian tube, left ear: Secondary | ICD-10-CM | POA: Diagnosis not present

## 2017-12-21 DIAGNOSIS — J40 Bronchitis, not specified as acute or chronic: Secondary | ICD-10-CM | POA: Diagnosis not present

## 2018-05-19 DIAGNOSIS — Z6833 Body mass index (BMI) 33.0-33.9, adult: Secondary | ICD-10-CM | POA: Diagnosis not present

## 2018-05-19 DIAGNOSIS — H6983 Other specified disorders of Eustachian tube, bilateral: Secondary | ICD-10-CM | POA: Diagnosis not present

## 2018-05-19 DIAGNOSIS — J0100 Acute maxillary sinusitis, unspecified: Secondary | ICD-10-CM | POA: Diagnosis not present

## 2018-05-19 DIAGNOSIS — R52 Pain, unspecified: Secondary | ICD-10-CM | POA: Diagnosis not present

## 2018-05-27 DIAGNOSIS — Z1231 Encounter for screening mammogram for malignant neoplasm of breast: Secondary | ICD-10-CM | POA: Diagnosis not present

## 2018-05-31 DIAGNOSIS — J209 Acute bronchitis, unspecified: Secondary | ICD-10-CM | POA: Diagnosis not present

## 2018-05-31 DIAGNOSIS — J019 Acute sinusitis, unspecified: Secondary | ICD-10-CM | POA: Diagnosis not present

## 2018-05-31 DIAGNOSIS — J310 Chronic rhinitis: Secondary | ICD-10-CM | POA: Diagnosis not present

## 2018-05-31 DIAGNOSIS — J329 Chronic sinusitis, unspecified: Secondary | ICD-10-CM | POA: Diagnosis not present

## 2018-11-02 DIAGNOSIS — Z1389 Encounter for screening for other disorder: Secondary | ICD-10-CM | POA: Diagnosis not present

## 2018-11-02 DIAGNOSIS — N760 Acute vaginitis: Secondary | ICD-10-CM | POA: Diagnosis not present

## 2018-11-02 DIAGNOSIS — Z130 Encounter for screening for diseases of the blood and blood-forming organs and certain disorders involving the immune mechanism: Secondary | ICD-10-CM | POA: Diagnosis not present

## 2018-11-02 DIAGNOSIS — Z01419 Encounter for gynecological examination (general) (routine) without abnormal findings: Secondary | ICD-10-CM | POA: Diagnosis not present

## 2018-11-02 DIAGNOSIS — Z6832 Body mass index (BMI) 32.0-32.9, adult: Secondary | ICD-10-CM | POA: Diagnosis not present

## 2019-02-02 ENCOUNTER — Other Ambulatory Visit: Payer: Self-pay

## 2019-02-02 DIAGNOSIS — Z20822 Contact with and (suspected) exposure to covid-19: Secondary | ICD-10-CM

## 2019-02-03 LAB — NOVEL CORONAVIRUS, NAA: SARS-CoV-2, NAA: DETECTED — AB

## 2019-02-22 DIAGNOSIS — J9801 Acute bronchospasm: Secondary | ICD-10-CM | POA: Diagnosis not present

## 2019-02-22 DIAGNOSIS — J019 Acute sinusitis, unspecified: Secondary | ICD-10-CM | POA: Diagnosis not present

## 2019-05-23 DIAGNOSIS — J310 Chronic rhinitis: Secondary | ICD-10-CM | POA: Diagnosis not present

## 2019-05-23 DIAGNOSIS — H9193 Unspecified hearing loss, bilateral: Secondary | ICD-10-CM | POA: Diagnosis not present

## 2019-05-23 DIAGNOSIS — J329 Chronic sinusitis, unspecified: Secondary | ICD-10-CM | POA: Diagnosis not present

## 2019-05-23 DIAGNOSIS — R42 Dizziness and giddiness: Secondary | ICD-10-CM | POA: Diagnosis not present

## 2019-06-02 DIAGNOSIS — Z1231 Encounter for screening mammogram for malignant neoplasm of breast: Secondary | ICD-10-CM | POA: Diagnosis not present

## 2019-08-18 DIAGNOSIS — K5900 Constipation, unspecified: Secondary | ICD-10-CM | POA: Diagnosis not present

## 2019-08-18 DIAGNOSIS — R300 Dysuria: Secondary | ICD-10-CM | POA: Diagnosis not present

## 2019-08-18 DIAGNOSIS — Z0000 Encounter for general adult medical examination without abnormal findings: Secondary | ICD-10-CM | POA: Diagnosis not present

## 2019-08-18 DIAGNOSIS — J309 Allergic rhinitis, unspecified: Secondary | ICD-10-CM | POA: Diagnosis not present

## 2019-08-18 DIAGNOSIS — Z0001 Encounter for general adult medical examination with abnormal findings: Secondary | ICD-10-CM | POA: Diagnosis not present

## 2019-08-18 DIAGNOSIS — Z6833 Body mass index (BMI) 33.0-33.9, adult: Secondary | ICD-10-CM | POA: Diagnosis not present

## 2019-08-18 DIAGNOSIS — Z1389 Encounter for screening for other disorder: Secondary | ICD-10-CM | POA: Diagnosis not present

## 2019-08-18 DIAGNOSIS — J329 Chronic sinusitis, unspecified: Secondary | ICD-10-CM | POA: Diagnosis not present

## 2019-08-18 DIAGNOSIS — R7309 Other abnormal glucose: Secondary | ICD-10-CM | POA: Diagnosis not present

## 2019-08-18 DIAGNOSIS — E161 Other hypoglycemia: Secondary | ICD-10-CM | POA: Diagnosis not present

## 2019-09-05 DIAGNOSIS — K7689 Other specified diseases of liver: Secondary | ICD-10-CM | POA: Diagnosis not present

## 2019-09-05 DIAGNOSIS — R7309 Other abnormal glucose: Secondary | ICD-10-CM | POA: Diagnosis not present

## 2019-09-05 DIAGNOSIS — K5900 Constipation, unspecified: Secondary | ICD-10-CM | POA: Diagnosis not present

## 2019-09-05 DIAGNOSIS — Z9049 Acquired absence of other specified parts of digestive tract: Secondary | ICD-10-CM | POA: Diagnosis not present

## 2019-10-07 ENCOUNTER — Other Ambulatory Visit (HOSPITAL_COMMUNITY)
Admission: AD | Admit: 2019-10-07 | Discharge: 2019-10-07 | Disposition: A | Discharge status: Home / Self Care | Payer: Federal, State, Local not specified - PPO | Source: Skilled Nursing Facility | Attending: Urology | Admitting: Urology

## 2019-10-07 ENCOUNTER — Other Ambulatory Visit: Payer: Self-pay

## 2019-10-07 ENCOUNTER — Encounter: Payer: Self-pay | Admitting: Urology

## 2019-10-07 ENCOUNTER — Ambulatory Visit: Payer: Federal, State, Local not specified - PPO | Admitting: Urology

## 2019-10-07 VITALS — BP 107/70 | HR 71 | Temp 97.9°F | Ht 65.0 in | Wt 203.0 lb

## 2019-10-07 DIAGNOSIS — Z87440 Personal history of urinary (tract) infections: Secondary | ICD-10-CM | POA: Diagnosis not present

## 2019-10-07 DIAGNOSIS — R300 Dysuria: Secondary | ICD-10-CM

## 2019-10-07 DIAGNOSIS — R102 Pelvic and perineal pain: Secondary | ICD-10-CM | POA: Diagnosis not present

## 2019-10-07 LAB — POCT URINALYSIS DIPSTICK
Bilirubin, UA: NEGATIVE
Blood, UA: NEGATIVE
Glucose, UA: NEGATIVE
Ketones, UA: NEGATIVE
Leukocytes, UA: NEGATIVE
Nitrite, UA: NEGATIVE
Protein, UA: NEGATIVE
Spec Grav, UA: <=1.005 — AB (ref 1.010–1.025)
Urobilinogen, UA: NEGATIVE E.U./dL — AB
pH, UA: 5 (ref 5.0–8.0)

## 2019-10-07 LAB — BLADDER SCAN: Scan Result: 92.9

## 2019-10-07 MED ORDER — URIBEL 118 MG PO CAPS: 1.0000 | ORAL_CAPSULE | Freq: Four times a day (QID) | ORAL | 2 refills | Status: DC | PRN

## 2019-10-07 MED ORDER — NITROFURANTOIN MONOHYD MACRO 100 MG PO CAPS: 100.0000 mg | ORAL_CAPSULE | Freq: Two times a day (BID) | ORAL | 0 refills | Status: DC

## 2019-10-07 NOTE — Assessment & Plan Note (Signed)
Her pain is after voiding and her bladder is tender on exam.  I will give her Uribel for symptom relief in addition to the antibiotic.  She will return in a month and if unimproved, I will consider cystoscopy.

## 2019-10-07 NOTE — Progress Notes (Signed)
Subjective: 1. Stranguria   2. Pelvic pain in female   3. Personal history of urinary infection      Emily Johns is a 49 yo female who was sent by Rowan Blase PA for pain after voiding that began gradually about 2 months ago.  She has no urgency or incontinence.  She had a recent clear urine.  She has no significant frequency.  She drinks a gallon of water a day for weight loss.  She has occasional nocturia based on fluid intake.  She has had some prior UTI's chronically and had urethral dilations x 3 as a child.   She had e. Coli on culture in 2014.  She has had no stones or hematuria.  The pain is moderate and will last 5-10 min after voiding.  She had an abdominal US at [facility] recently that was ok.  Her UA is clear today.  She has had no GU surgery.  She has issues with constipation and is on magnesium supplement.  She is to see a GI.  She has no vaginal bleeding but has occasional dysparunia.     ROS:  ROS  Allergies  Allergen Reactions  . Other Shortness Of Breath and Other (See Comments)    Selling, throat closes Iodine, betadine--due to shellfish allergy  Selling, throat closes  . Penicillins Anaphylaxis, Swelling and Rash    Has patient had a PCN reaction causing immediate rash, facial/tongue/throat swelling, SOB or lightheadedness with hypotension: Yes Has patient had a PCN reaction causing severe rash involving mucus membranes or skin necrosis: Unknown Has patient had a PCN reaction that required hospitalization: No Has patient had a PCN reaction occurring within the last 10 years: Yes If all of the above answers are "NO", then may proceed with Cephalosporin use.   . Shellfish Allergy Shortness Of Breath    Selling, throat closes  . Iodinated Diagnostic Agents Other (See Comments)    Iodine, betadine--due to shellfish allergy  Other reaction(s): Other (See Comments) Iodine, betadine--due to shellfish allergy  Other reaction(s): Other (See Comments) Iodine, betadine--due to shellfish  allergy    . Levaquin [Levofloxacin In D5w] Other (See Comments)    Patient cannot remember reaction.  . Lodine [Etodolac] Palpitations    Past Medical History:  Diagnosis Date  . Arthritis   . Asthma   . Hiatal hernia   . Hyperlipemia   . PONV (postoperative nausea and vomiting)   . Schatzki's ring   . Vitamin D deficiency     Past Surgical History:  Procedure Laterality Date  . ABDOMINAL HYSTERECTOMY     partial  . APPENDECTOMY    . CESAREAN SECTION     x3  . CHOLECYSTECTOMY    . EXPLORATORY LAPAROTOMY    . LAPAROSCOPIC LYSIS OF ADHESIONS  05/06/2012   Procedure: LAPAROSCOPIC LYSIS OF ADHESIONS;  Surgeon: Cheri Fowler, MD;  Location: [facility];  Service: Gynecology;  Laterality: N/A;  . LAPAROSCOPIC NISSEN FUNDOPLICATION N/A 5/57/3220   Procedure: LAPAROSCOPIC hiatal hernia repair with fundoplication;  Surgeon: Kieth Brightly Arta Bruce, MD;  Location: [facility];  Service: General;  Laterality: N/A;  . LAPAROSCOPY  05/06/2012   Procedure: LAPAROSCOPY OPERATIVE;  Surgeon: Cheri Fowler, MD;  Location: [facility];  Service: Gynecology;  Laterality: N/A;  . SALPINGOOPHORECTOMY  05/06/2012   Procedure: SALPINGO OOPHORECTOMY;  Surgeon: Cheri Fowler, MD;  Location: [facility];  Service: Gynecology;  Laterality: Left;    Social History   Socioeconomic History  . Marital status: Married    Spouse  name: Not on file  . Number of children: 3  . Years of education: Not on file  . Highest education level: Not on file  Occupational History  . Occupation: Medical illustrator  Tobacco Use  . Smoking status: Never Smoker  . Smokeless tobacco: Never Used  Vaping Use  . Vaping Use: Never used  Substance and Sexual Activity  . Alcohol use: No    Alcohol/week: 0.0 standard drinks  . Drug use: No  . Sexual activity: Yes    Birth control/protection: Surgical, None    Comment: hysterectomy  Other Topics Concern  . Not on file  Social History Narrative  . Not on file   Social Determinants of Health    Financial Resource Strain:   . Difficulty of Paying Living Expenses:   Food Insecurity:   . Worried About Charity fundraiser in the Last Year:   . Arboriculturist in the Last Year:   Transportation Needs:   . Film/video editor (Medical):   Marland Kitchen Lack of Transportation (Non-Medical):   Physical Activity:   . Days of Exercise per Week:   . Minutes of Exercise per Session:   Stress:   . Feeling of Stress :   Social Connections:   . Frequency of Communication with Friends and Family:   . Frequency of Social Gatherings with Friends and Family:   . Attends Religious Services:   . Active Member of Clubs or Organizations:   . Attends Archivist Meetings:   Marland Kitchen Marital Status:   Intimate Partner Violence:   . Fear of Current or Ex-Partner:   . Emotionally Abused:   Marland Kitchen Physically Abused:   . Sexually Abused:     Family History  Problem Relation Age of Onset  . Hyperlipidemia Father   . Hypertension Father   . Hypertension Sister   . Heart disease Paternal Grandfather   . Breast cancer Neg Hx   . Stomach cancer Neg Hx   . Esophageal cancer Neg Hx   . Colon cancer Neg Hx     Anti-infectives: Anti-infectives (From admission, onward)   Start     Dose/Rate Route Frequency Ordered Stop   10/07/19 0000  nitrofurantoin, macrocrystal-monohydrate, (MACROBID) 100 MG capsule     Discontinue     100 mg Oral Every 12 hours 10/07/19 1559     10/07/19 0000  Meth-Hyo-M Bl-Na Phos-Ph Sal (URIBEL) 118 MG CAPS     Discontinue     1 capsule Oral 4 times daily PRN 10/07/19 1600        Current Outpatient Medications  Medication Sig Dispense Refill  . albuterol (VENTOLIN HFA) 108 (90 Base) MCG/ACT inhaler Inhale into the lungs.    Marland Kitchen FIBER PO Take 1-2 tablets by mouth daily.     . fluticasone (FLONASE) 50 MCG/ACT nasal spray Place 2 sprays into both nostrils daily.    Marland Kitchen gabapentin (NEURONTIN) 300 MG capsule Take 1 capsule (300 mg total) by mouth 2 (two) times daily. 30 capsule 0  .  HYDROcodone-acetaminophen (NORCO/VICODIN) 5-325 MG tablet Take 1-2 tablets by mouth every 6 (six) hours as needed for moderate pain. 30 tablet 0  . hydrocortisone (ANUSOL-HC) 25 MG suppository Use 1 suppository at bedtime.    Marland Kitchen ibuprofen (ADVIL,MOTRIN) 200 MG tablet Take 400-600 mg by mouth every 8 (eight) hours as needed (for pain.).     Marland Kitchen ondansetron (ZOFRAN-ODT) 4 MG disintegrating tablet Take 1 tablet (4 mg total) by mouth every 6 (six) hours  as needed for nausea. 20 tablet 0  . pantoprazole (PROTONIX) 40 MG tablet 40 mg daily -30 minutes before first meal of the day (Patient taking differently: Take 40 mg by mouth daily before breakfast. 40 mg daily -30 minutes before first meal of the day) 30 tablet 6  . Meth-Hyo-M Bl-Na Phos-Ph Sal (URIBEL) 118 MG CAPS Take 1 capsule (118 mg total) by mouth 4 (four) times daily as needed (burning). 30 capsule 2  . nitrofurantoin, macrocrystal-monohydrate, (MACROBID) 100 MG capsule Take 1 capsule (100 mg total) by mouth every 12 (twelve) hours. 14 capsule 0   No current facility-administered medications for this visit.     Objective: Vital signs in last 24 hours: BP 107/70   Pulse 71   Temp 97.9 F (36.6 C)   Ht 5\' 5"  (1.651 m)   Wt 203 lb (92.1 kg)   BMI 33.78 kg/m   Intake/Output from previous day: No intake/output data recorded. Intake/Output this shift: @IOTHISSHIFT @   Physical Exam Vitals reviewed.  Constitutional:      Appearance: Normal appearance.  Cardiovascular:     Rate and Rhythm: Normal rate and regular rhythm.     Heart sounds: Normal heart sounds.  Pulmonary:     Effort: Pulmonary effort is normal. No respiratory distress.     Breath sounds: Normal breath sounds.  Abdominal:     Palpations: Abdomen is soft. There is no mass.     Tenderness: There is abdominal tenderness (mild suprapubic).     Hernia: No hernia is present.  Genitourinary:    Comments: Nl external genitalia. No introital stenosis or vaginal  atrophy. Small amount or redundent mucosa in the suburethral area without tenderness to suggest a diverticulum. No cystocele or rectocele. Bladder is tender without mass.  Cervix and uterus surgically absent.  No adnexal masses noted.  Musculoskeletal:        General: No swelling or tenderness. Normal range of motion.     Cervical back: Normal range of motion and neck supple.  Skin:    General: Skin is warm and dry.  Neurological:     General: No focal deficit present.     Mental Status: She is alert and oriented to person, place, and time.  Psychiatric:        Mood and Affect: Mood normal.     Lab Results:  Results for orders placed or performed in visit on 10/07/19 (from the past 24 hour(s))  POCT urinalysis dipstick     Status: Abnormal   Collection Time: 10/07/19  3:24 PM  Result Value Ref Range   Color, UA yellow    Clarity, UA clear    Glucose, UA Negative Negative   Bilirubin, UA neg    Ketones, UA neg    Spec Grav, UA <=1.005 (A) 1.010 - 1.025   Blood, UA neg    pH, UA 5.0 5.0 - 8.0   Protein, UA Negative Negative   Urobilinogen, UA negative (A) 0.2 or 1.0 E.U./dL   Nitrite, UA neg    Leukocytes, UA Negative Negative   Appearance     Odor      BMET No results for input(s): NA, K, CL, CO2, GLUCOSE, BUN, CREATININE, CALCIUM in the last 72 hours. PT/INR No results for input(s): LABPROT, INR in the last 72 hours. ABG No results for input(s): PHART, HCO3 in the last 72 hours.  Invalid input(s): PCO2, PO2  Studies/Results: Abdominal [facility] from [facility] films and report reviewed.  No renal abnormalities noted.  Bladder PVR by [place] today was 16ml and no bladder lesions were identified.    Assessment/Plan: Personal history of urinary infection She has had prior UTI's and while her UA is clear, she is drinking a gallon of water a day.  I will get a culture for completeness and give her an empiric trial of macrobid.    Stranguria Her pain is after voiding and her  bladder is tender on exam.  I will give her Uribel for symptom relief in addition to the antibiotic.  She will return in a month and if unimproved, I will consider cystoscopy.    Meds ordered this encounter  Medications  . nitrofurantoin, macrocrystal-monohydrate, (MACROBID) 100 MG capsule    Sig: Take 1 capsule (100 mg total) by mouth every 12 (twelve) hours.    Dispense:  14 capsule    Refill:  0  . Meth-Hyo-M Bl-Na Phos-Ph Sal (URIBEL) 118 MG CAPS    Sig: Take 1 capsule (118 mg total) by mouth 4 (four) times daily as needed (burning).    Dispense:  30 capsule    Refill:  2     Orders Placed This Encounter  Procedures  . Bladder scan  . POCT urinalysis dipstick     Return in about 1 month (around 11/07/2019) for Possible cystoscopy.    CC: Rowan Blase PA.      Irine Seal 10/07/2019 [phone]

## 2019-10-07 NOTE — Addendum Note (Signed)
Addended by: Irine Seal on: 10/07/2019 04:16 PM   Modules accepted: Orders

## 2019-10-07 NOTE — Assessment & Plan Note (Signed)
She has had prior UTI's and while her UA is clear, she is drinking a gallon of water a day.  I will get a culture for completeness and give her an empiric trial of macrobid.

## 2019-10-07 NOTE — Progress Notes (Signed)
Urological Symptom Review  Patient is experiencing the following symptoms: Burning/pain with urination Painful intercourse   Review of Systems  Gastrointestinal (upper)  : Negative for upper GI symptoms  Gastrointestinal (lower) : Constipation  Constitutional : Negative for symptoms  Skin: Negative for skin symptoms  Eyes: Negative for eye symptoms  Ear/Nose/Throat : Sinus problems  Hematologic/Lymphatic: Negative for Hematologic/Lymphatic symptoms  Cardiovascular : Negative for cardiovascular symptoms  Respiratory : Negative for respiratory symptoms  Endocrine: Negative for endocrine symptoms  Musculoskeletal: Negative for musculoskeletal symptoms  Neurological: Negative for neurological symptoms  Psychologic: Negative for psychiatric symptoms

## 2019-10-08 LAB — URINE CULTURE: Culture: 30000 — AB

## 2019-10-12 ENCOUNTER — Telehealth: Payer: Self-pay

## 2019-10-12 NOTE — Telephone Encounter (Signed)
-----   Message from Irine Seal, MD sent at 10/12/2019  8:21 AM EDT ----- Strep on culture.  Finish macrobid. ----- Message ----- From: Iris Pert, LPN Sent: 3/79/4327   4:45 PM EDT To: Irine Seal, MD  Please review

## 2019-10-12 NOTE — Telephone Encounter (Signed)
Pt called and notified

## 2019-10-14 ENCOUNTER — Ambulatory Visit: Payer: Federal, State, Local not specified - PPO | Admitting: Nurse Practitioner

## 2019-10-25 DIAGNOSIS — R531 Weakness: Secondary | ICD-10-CM | POA: Diagnosis not present

## 2019-10-25 DIAGNOSIS — E6609 Other obesity due to excess calories: Secondary | ICD-10-CM | POA: Diagnosis not present

## 2019-10-25 DIAGNOSIS — Z6832 Body mass index (BMI) 32.0-32.9, adult: Secondary | ICD-10-CM | POA: Diagnosis not present

## 2019-10-25 DIAGNOSIS — R102 Pelvic and perineal pain: Secondary | ICD-10-CM | POA: Diagnosis not present

## 2019-10-25 DIAGNOSIS — R319 Hematuria, unspecified: Secondary | ICD-10-CM | POA: Diagnosis not present

## 2019-11-09 ENCOUNTER — Ambulatory Visit: Payer: Federal, State, Local not specified - PPO | Admitting: Nurse Practitioner

## 2019-11-23 ENCOUNTER — Telehealth: Payer: Self-pay

## 2019-11-23 NOTE — Telephone Encounter (Signed)
-----   Message from Irine Seal, MD sent at 11/22/2019  9:04 AM EDT ----- If she is doing better after the antibiotics, we could just have her return in about 3 months.  If she is still symptomatic, have her come drop a urine for UA and culture and set her up for return in 2-3 weeks if possible.

## 2019-11-23 NOTE — Telephone Encounter (Signed)
I called pt. Pt wishes to be seen d/t her symptoms not improving. Spoke with Dr. Alyson Ingles- who will see pt this Friday. Pt agreed.

## 2019-11-25 ENCOUNTER — Encounter: Payer: Self-pay | Admitting: Urology

## 2019-11-25 ENCOUNTER — Ambulatory Visit (INDEPENDENT_AMBULATORY_CARE_PROVIDER_SITE_OTHER): Payer: Federal, State, Local not specified - PPO | Admitting: Urology

## 2019-11-25 ENCOUNTER — Other Ambulatory Visit: Payer: Self-pay

## 2019-11-25 VITALS — BP 110/71 | HR 61 | Temp 97.8°F | Ht 65.0 in | Wt 203.0 lb

## 2019-11-25 DIAGNOSIS — R1030 Lower abdominal pain, unspecified: Secondary | ICD-10-CM | POA: Diagnosis not present

## 2019-11-25 DIAGNOSIS — Z87440 Personal history of urinary (tract) infections: Secondary | ICD-10-CM

## 2019-11-25 LAB — URINALYSIS, ROUTINE W REFLEX MICROSCOPIC
Bilirubin, UA: NEGATIVE
Glucose, UA: NEGATIVE
Ketones, UA: NEGATIVE
Leukocytes,UA: NEGATIVE
Nitrite, UA: NEGATIVE
Protein,UA: NEGATIVE
RBC, UA: NEGATIVE
Specific Gravity, UA: 1.01 (ref 1.005–1.030)
Urobilinogen, Ur: 0.2 mg/dL (ref 0.2–1.0)
pH, UA: 6.5 (ref 5.0–7.5)

## 2019-11-25 MED ORDER — SULFAMETHOXAZOLE-TRIMETHOPRIM 800-160 MG PO TABS: 1.0000 | ORAL_TABLET | Freq: Once | ORAL | 0 refills | Status: AC

## 2019-11-25 MED ORDER — SULFAMETHOXAZOLE-TRIMETHOPRIM 800-160 MG PO TABS: 1.0000 | ORAL_TABLET | Freq: Once | ORAL | 0 refills | Status: DC

## 2019-11-25 NOTE — Progress Notes (Addendum)
11/25/2019 10:42 AM   Emily Johns 1970-10-29 [id number]  Referring provider: [facility] [address] [place],  [place] [address]  Painful urination  HPI: Emily Johns is a 49yo here for followup for dysuria and painful urination. She last saw Dr. Jeffie Johns on 7/9/20201 and was started on macrobic for 7 days and Uribel prn for the painful urination. She continues to have intermittent dysuira t the end of urination. No hematuria. No recent UTI. She has rare nacturia but drinks 1 gallon of water daily.    PMH: Past Medical History:  Diagnosis Date  . Arthritis   . Asthma   . Hiatal hernia   . Hyperlipemia   . PONV (postoperative nausea and vomiting)   . Schatzki's ring   . Vitamin D deficiency     Surgical History: Past Surgical History:  Procedure Laterality Date  . ABDOMINAL HYSTERECTOMY     partial  . APPENDECTOMY    . CESAREAN SECTION     x3  . CHOLECYSTECTOMY    . EXPLORATORY LAPAROTOMY    . LAPAROSCOPIC LYSIS OF ADHESIONS  05/06/2012   Procedure: LAPAROSCOPIC LYSIS OF ADHESIONS;  Surgeon: Cheri Fowler, MD;  Location: [facility];  Service: Gynecology;  Laterality: N/A;  . LAPAROSCOPIC NISSEN FUNDOPLICATION N/A 3/81/8299   Procedure: LAPAROSCOPIC hiatal hernia repair with fundoplication;  Surgeon: Kieth Brightly Arta Bruce, MD;  Location: [facility];  Service: General;  Laterality: N/A;  . LAPAROSCOPY  05/06/2012   Procedure: LAPAROSCOPY OPERATIVE;  Surgeon: Cheri Fowler, MD;  Location: [facility];  Service: Gynecology;  Laterality: N/A;  . SALPINGOOPHORECTOMY  05/06/2012   Procedure: SALPINGO OOPHORECTOMY;  Surgeon: Cheri Fowler, MD;  Location: [facility];  Service: Gynecology;  Laterality: Left;    Home Medications:  Allergies as of 11/25/2019      Reactions   Other Shortness Of Breath, Other (See Comments)   Selling, throat closes Iodine, betadine--due to shellfish allergy  Selling, throat closes   Penicillins Anaphylaxis, Swelling, Rash   Has patient  had a PCN reaction causing immediate rash, facial/tongue/throat swelling, SOB or lightheadedness with hypotension: Yes Has patient had a PCN reaction causing severe rash involving mucus membranes or skin necrosis: Unknown Has patient had a PCN reaction that required hospitalization: No Has patient had a PCN reaction occurring within the last 10 years: Yes If all of the above answers are "NO", then may proceed with Cephalosporin use.   Shellfish Allergy Shortness Of Breath   Selling, throat closes   Iodinated Diagnostic Agents Other (See Comments)   Iodine, betadine--due to shellfish allergy  Other reaction(s): Other (See Comments) Iodine, betadine--due to shellfish allergy  Other reaction(s): Other (See Comments) Iodine, betadine--due to shellfish allergy    Levaquin [levofloxacin In D5w] Other (See Comments)   Patient cannot remember reaction.   Lodine [etodolac] Palpitations      Medication List       Accurate as of November 25, 2019 10:42 AM. If you have any questions, ask your nurse or doctor.        albuterol 108 (90 Base) MCG/ACT inhaler Commonly known as: VENTOLIN HFA Inhale into the lungs.   FIBER PO Take 1-2 tablets by mouth daily.   fluticasone 50 MCG/ACT nasal spray Commonly known as: FLONASE Place 2 sprays into both nostrils daily.   gabapentin 300 MG capsule Commonly known as: NEURONTIN Take 1 capsule (300 mg total) by mouth 2 (two) times daily.   HYDROcodone-acetaminophen 5-325 MG tablet Commonly known as: NORCO/VICODIN Take  1-2 tablets by mouth every 6 (six) hours as needed for moderate pain.   hydrocortisone 25 MG suppository Commonly known as: ANUSOL-HC Use 1 suppository at bedtime.   ibuprofen 200 MG tablet Commonly known as: ADVIL Take 400-600 mg by mouth every 8 (eight) hours as needed (for pain.).   nitrofurantoin (macrocrystal-monohydrate) 100 MG capsule Commonly known as: MACROBID Take 1 capsule (100 mg total) by mouth every 12 (twelve)  hours.   ondansetron 4 MG disintegrating tablet Commonly known as: ZOFRAN-ODT Take 1 tablet (4 mg total) by mouth every 6 (six) hours as needed for nausea.   pantoprazole 40 MG tablet Commonly known as: PROTONIX 40 mg daily -30 minutes before first meal of the day What changed:   how much to take  how to take this  when to take this   Uribel 118 MG Caps Take 1 capsule (118 mg total) by mouth 4 (four) times daily as needed (burning).       Allergies:  Allergies  Allergen Reactions  . Other Shortness Of Breath and Other (See Comments)    Selling, throat closes Iodine, betadine--due to shellfish allergy  Selling, throat closes  . Penicillins Anaphylaxis, Swelling and Rash    Has patient had a PCN reaction causing immediate rash, facial/tongue/throat swelling, SOB or lightheadedness with hypotension: Yes Has patient had a PCN reaction causing severe rash involving mucus membranes or skin necrosis: Unknown Has patient had a PCN reaction that required hospitalization: No Has patient had a PCN reaction occurring within the last 10 years: Yes If all of the above answers are "NO", then may proceed with Cephalosporin use.   . Shellfish Allergy Shortness Of Breath    Selling, throat closes  . Iodinated Diagnostic Agents Other (See Comments)    Iodine, betadine--due to shellfish allergy  Other reaction(s): Other (See Comments) Iodine, betadine--due to shellfish allergy  Other reaction(s): Other (See Comments) Iodine, betadine--due to shellfish allergy    . Levaquin [Levofloxacin In D5w] Other (See Comments)    Patient cannot remember reaction.  . Lodine [Etodolac] Palpitations    Family History: Family History  Problem Relation Age of Onset  . Hyperlipidemia Father   . Hypertension Father   . Hypertension Sister   . Heart disease Paternal Grandfather   . Breast cancer Neg Hx   . Stomach cancer Neg Hx   . Esophageal cancer Neg Hx   . Colon cancer Neg Hx     Social  History:  reports that she has never smoked. She has never used smokeless tobacco. She reports that she does not drink alcohol and does not use drugs.  ROS: All other review of systems were reviewed and are negative except what is noted above in HPI  Physical Exam: BP 110/71   Pulse 61   Temp 97.8 F (36.6 C)   Ht 5\' 5"  (1.651 m)   Wt 203 lb (92.1 kg)   BMI 33.78 kg/m   Constitutional:  Alert and oriented, No acute distress. HEENT: [place] AT, moist mucus membranes.  Trachea midline, no masses. Cardiovascular: No clubbing, cyanosis, or edema. Respiratory: Normal respiratory effort, no increased work of breathing. GI: Abdomen is soft, nontender, nondistended, no abdominal masses GU: No CVA tenderness.  Lymph: No cervical or inguinal lymphadenopathy. Skin: No rashes, bruises or suspicious lesions. Neurologic: Grossly intact, no focal deficits, moving all 4 extremities. Psychiatric: Normal mood and affect.  Laboratory Data: Lab Results  Component Value Date   WBC 7.5 07/17/2017   HGB 11.5 (L)  07/17/2017   HCT 34.9 (L) 07/17/2017   MCV 93.3 07/17/2017   PLT 283 07/17/2017    Lab Results  Component Value Date   CREATININE 0.53 07/17/2017    No results found for: PSA  No results found for: TESTOSTERONE  Lab Results  Component Value Date   HGBA1C 4.8 05/30/2016    Urinalysis    Component Value Date/Time   COLORURINE YELLOW 11/13/2010 2156   APPEARANCEUR Clear 11/25/2019 1004   LABSPEC 1.013 11/13/2010 2156   PHURINE 6.5 11/13/2010 2156   GLUCOSEU Negative 11/25/2019 1004   HGBUR NEGATIVE 11/13/2010 2156   BILIRUBINUR Negative 11/25/2019 1004   KETONESUR NEGATIVE 11/13/2010 2156   PROTEINUR Negative 11/25/2019 [place] 11/13/2010 2156   UROBILINOGEN negative (A) 10/07/2019 1524   UROBILINOGEN 1.0 11/13/2010 2156   NITRITE Negative 11/25/2019 1004   NITRITE NEGATIVE 11/13/2010 2156   LEUKOCYTESUR Negative 11/25/2019 1004    Lab Results    Component Value Date   LABMICR Comment 11/25/2019   MUCUS negative 09/06/2012   BACTERIA many 09/06/2012    Pertinent Imaging:  Results for orders placed in visit on 07/11/14  DG Abd 1 View  Narrative CLINICAL DATA:  Three-week history.  Lower abdominal pain.  EXAM: ABDOMEN - 1 VIEW  COMPARISON:  None.  FINDINGS: The bowel gas pattern is normal. No radio-opaque calculi or other significant radiographic abnormality are seen. Moderate stool burden hepatic flexure. Scoliosis. There appears to be a small surgical clip lying within or overlying the abdomen projecting to the LEFT of L3-L4. This probably relates to prior abdominal surgery.  IMPRESSION: No acute intra-abdominal findings.   Electronically Signed By: Rolla Flatten M.D. On: 07/11/2014 11:21  No results found for this or any previous visit.  No results found for this or any previous visit.  No results found for this or any previous visit.  No results found for this or any previous visit.  No results found for this or any previous visit.  No results found for this or any previous visit.  No results found for this or any previous visit.   Assessment & Plan:    1. Dysuria/suprapubic pain - Urinalysis, Routine w reflex microscopic -bactrim DS BID for 7 days -CT stone study   No follow-ups on file.  Nicolette Bang, MD  [facility]    Cystoscopy Procedure Note  Patient identification was confirmed, informed consent was obtained, and patient was prepped using Betadine solution.  Lidocaine jelly was administered per urethral meatus.    Procedure: - Flexible cystoscope introduced, without any difficulty.   - Thorough search of the bladder revealed:    normal urethral meatus    normal urothelium    no stones    no ulcers     no tumors    no urethral polyps    no trabeculation  - Ureteral orifices were normal in position and appearance.  Post-Procedure: - Patient  tolerated the procedure well  Assessment/ Plan:    Return in about 2 weeks (around 12/09/2019).  Nicolette Bang, MD

## 2019-11-25 NOTE — Progress Notes (Signed)
Urological Symptom Review  Patient is experiencing the following symptoms: Burning/pain with urination Get up at night to urinate Urinary tract infection   Review of Systems  Gastrointestinal (upper)  : negative  Gastrointestinal (lower) : Negative for lower GI symptoms  Constitutional : Negative for symptoms  Skin: Negative for skin symptoms  Eyes: Negative for eye symptoms  Ear/Nose/Throat : Negative for Ear/Nose/Throat symptoms  Hematologic/Lymphatic: Negative for Hematologic/Lymphatic symptoms  Cardiovascular : Negative for cardiovascular symptoms  Respiratory : Negative for respiratory symptoms  Endocrine: Negative for endocrine symptoms  Musculoskeletal: Negative for musculoskeletal symptoms  Neurological: Negative for neurological symptoms  Psychologic: Negative for psychiatric symptoms

## 2019-12-01 ENCOUNTER — Ambulatory Visit (HOSPITAL_COMMUNITY): Payer: Federal, State, Local not specified - PPO

## 2019-12-02 ENCOUNTER — Other Ambulatory Visit: Payer: Self-pay

## 2019-12-02 ENCOUNTER — Ambulatory Visit (HOSPITAL_COMMUNITY)
Admission: RE | Admit: 2019-12-02 | Discharge: 2019-12-02 | Disposition: A | Discharge status: Home / Self Care | Payer: Federal, State, Local not specified - PPO | Source: Ambulatory Visit | Attending: Urology | Admitting: Urology

## 2019-12-02 DIAGNOSIS — I700 Atherosclerosis of aorta: Secondary | ICD-10-CM | POA: Diagnosis not present

## 2019-12-02 DIAGNOSIS — K7689 Other specified diseases of liver: Secondary | ICD-10-CM | POA: Diagnosis not present

## 2019-12-02 DIAGNOSIS — R1030 Lower abdominal pain, unspecified: Secondary | ICD-10-CM

## 2019-12-02 DIAGNOSIS — K429 Umbilical hernia without obstruction or gangrene: Secondary | ICD-10-CM | POA: Diagnosis not present

## 2019-12-02 DIAGNOSIS — K449 Diaphragmatic hernia without obstruction or gangrene: Secondary | ICD-10-CM | POA: Diagnosis not present

## 2019-12-06 NOTE — Patient Instructions (Signed)

## 2019-12-08 NOTE — Progress Notes (Signed)
Sent via Mychart.

## 2019-12-09 ENCOUNTER — Other Ambulatory Visit (HOSPITAL_COMMUNITY): Payer: Federal, State, Local not specified - PPO

## 2019-12-13 DIAGNOSIS — N760 Acute vaginitis: Secondary | ICD-10-CM | POA: Diagnosis not present

## 2019-12-13 DIAGNOSIS — Z6832 Body mass index (BMI) 32.0-32.9, adult: Secondary | ICD-10-CM | POA: Diagnosis not present

## 2019-12-13 DIAGNOSIS — Z130 Encounter for screening for diseases of the blood and blood-forming organs and certain disorders involving the immune mechanism: Secondary | ICD-10-CM | POA: Diagnosis not present

## 2019-12-13 DIAGNOSIS — Z01419 Encounter for gynecological examination (general) (routine) without abnormal findings: Secondary | ICD-10-CM | POA: Diagnosis not present

## 2019-12-13 DIAGNOSIS — Z1389 Encounter for screening for other disorder: Secondary | ICD-10-CM | POA: Diagnosis not present

## 2019-12-13 DIAGNOSIS — N951 Menopausal and female climacteric states: Secondary | ICD-10-CM | POA: Diagnosis not present

## 2019-12-14 ENCOUNTER — Ambulatory Visit (INDEPENDENT_AMBULATORY_CARE_PROVIDER_SITE_OTHER): Payer: Federal, State, Local not specified - PPO | Admitting: Urology

## 2019-12-14 ENCOUNTER — Other Ambulatory Visit: Payer: Self-pay

## 2019-12-14 ENCOUNTER — Encounter: Payer: Self-pay | Admitting: Urology

## 2019-12-14 VITALS — BP 111/73 | HR 77 | Temp 98.8°F | Ht 65.0 in | Wt 203.0 lb

## 2019-12-14 DIAGNOSIS — Z87440 Personal history of urinary (tract) infections: Secondary | ICD-10-CM

## 2019-12-14 DIAGNOSIS — R300 Dysuria: Secondary | ICD-10-CM

## 2019-12-14 LAB — URINALYSIS, ROUTINE W REFLEX MICROSCOPIC
Bilirubin, UA: NEGATIVE
Glucose, UA: NEGATIVE
Ketones, UA: NEGATIVE
Leukocytes,UA: NEGATIVE
Nitrite, UA: NEGATIVE
Protein,UA: NEGATIVE
RBC, UA: NEGATIVE
Specific Gravity, UA: 1.02 (ref 1.005–1.030)
Urobilinogen, Ur: 1 mg/dL (ref 0.2–1.0)
pH, UA: 8.5 — ABNORMAL HIGH (ref 5.0–7.5)

## 2019-12-14 MED ORDER — FESOTERODINE FUMARATE ER 4 MG PO TB24: 4.0000 mg | ORAL_TABLET | Freq: Every day | ORAL | 0 refills | Status: DC

## 2019-12-14 NOTE — Progress Notes (Signed)
Urological Symptom Review  Patient is experiencing the following symptoms: Burning/pain with urination   Review of Systems  Gastrointestinal (upper)  : Negative for upper GI symptoms  Gastrointestinal (lower) : Negative for lower GI symptoms  Constitutional : Negative for symptoms  Skin: Negative for skin symptoms  Eyes: Negative for eye symptoms  Ear/Nose/Throat : Negative for Ear/Nose/Throat symptoms  Hematologic/Lymphatic: Negative for Hematologic/Lymphatic symptoms  Cardiovascular : Negative for cardiovascular symptoms  Respiratory : Negative for respiratory symptoms  Endocrine: Negative for endocrine symptoms  Musculoskeletal: Negative for musculoskeletal symptoms  Neurological: Negative for neurological symptoms  Psychologic: Negative for psychiatric symptoms 

## 2019-12-14 NOTE — Progress Notes (Signed)
12/14/2019 9:56 AM   Emily Johns Apr 22, 1970 [id number]  Referring provider: [facility], [place] [address] [place],  [place] [address]  Dysuria  HPI: Ms Emily Johns is a 49yo here for followup for dysuria. She noted no improvement in here suprapubic pain with mirabegron. CT stone study to not show any obvious stones. She denies nay urgency or urge incontinence.    PMH: Past Medical History:  Diagnosis Date   Arthritis    Asthma    Hiatal hernia    Hyperlipemia    PONV (postoperative nausea and vomiting)    Schatzki's ring    Vitamin D deficiency     Surgical History: Past Surgical History:  Procedure Laterality Date   ABDOMINAL HYSTERECTOMY     partial   APPENDECTOMY     CESAREAN SECTION     x3   CHOLECYSTECTOMY     EXPLORATORY LAPAROTOMY     LAPAROSCOPIC LYSIS OF ADHESIONS  05/06/2012   Procedure: LAPAROSCOPIC LYSIS OF ADHESIONS;  Surgeon: Cheri Fowler, MD;  Location: [facility];  Service: Gynecology;  Laterality: N/A;   LAPAROSCOPIC NISSEN FUNDOPLICATION N/A 9/79/4801   Procedure: LAPAROSCOPIC hiatal hernia repair with fundoplication;  Surgeon: Kieth Brightly Arta Bruce, MD;  Location: [facility];  Service: General;  Laterality: N/A;   LAPAROSCOPY  05/06/2012   Procedure: LAPAROSCOPY OPERATIVE;  Surgeon: Cheri Fowler, MD;  Location: [facility];  Service: Gynecology;  Laterality: N/A;   SALPINGOOPHORECTOMY  05/06/2012   Procedure: SALPINGO OOPHORECTOMY;  Surgeon: Cheri Fowler, MD;  Location: [facility];  Service: Gynecology;  Laterality: Left;    Home Medications:  Allergies as of 12/14/2019      Reactions   Other Shortness Of Breath, Other (See Comments)   Selling, throat closes Iodine, betadine--due to shellfish allergy  Selling, throat closes   Penicillins Anaphylaxis, Swelling, Rash   Has patient had a PCN reaction causing immediate rash, facial/tongue/throat swelling, SOB or lightheadedness with hypotension: Yes Has patient had a PCN  reaction causing severe rash involving mucus membranes or skin necrosis: Unknown Has patient had a PCN reaction that required hospitalization: No Has patient had a PCN reaction occurring within the last 10 years: Yes If all of the above answers are "NO", then may proceed with Cephalosporin use.   Shellfish Allergy Shortness Of Breath   Selling, throat closes   Iodinated Diagnostic Agents Other (See Comments)   Iodine, betadine--due to shellfish allergy  Other reaction(s): Other (See Comments) Iodine, betadine--due to shellfish allergy  Other reaction(s): Other (See Comments) Iodine, betadine--due to shellfish allergy    Levaquin [levofloxacin In D5w] Other (See Comments)   Patient cannot remember reaction.   Lodine [etodolac] Palpitations      Medication List       Accurate as of December 14, 2019  9:56 AM. If you have any questions, ask your nurse or doctor.        albuterol 108 (90 Base) MCG/ACT inhaler Commonly known as: VENTOLIN HFA Inhale into the lungs.   FIBER PO Take 1-2 tablets by mouth daily.   fluticasone 50 MCG/ACT nasal spray Commonly known as: FLONASE Place 2 sprays into both nostrils daily.   gabapentin 300 MG capsule Commonly known as: NEURONTIN Take 1 capsule (300 mg total) by mouth 2 (two) times daily.   HYDROcodone-acetaminophen 5-325 MG tablet Commonly known as: NORCO/VICODIN Take 1-2 tablets by mouth every 6 (six) hours as needed for moderate pain.   hydrocortisone 25 MG suppository Commonly known as: ANUSOL-HC Use 1 suppository  at bedtime.   ibuprofen 200 MG tablet Commonly known as: ADVIL Take 400-600 mg by mouth every 8 (eight) hours as needed (for pain.).   nitrofurantoin (macrocrystal-monohydrate) 100 MG capsule Commonly known as: MACROBID Take 1 capsule (100 mg total) by mouth every 12 (twelve) hours.   ondansetron 4 MG disintegrating tablet Commonly known as: ZOFRAN-ODT Take 1 tablet (4 mg total) by mouth every 6 (six) hours as  needed for nausea.   pantoprazole 40 MG tablet Commonly known as: PROTONIX 40 mg daily -30 minutes before first meal of the day   Uribel 118 MG Caps Take 1 capsule (118 mg total) by mouth 4 (four) times daily as needed (burning).       Allergies:  Allergies  Allergen Reactions   Other Shortness Of Breath and Other (See Comments)    Selling, throat closes Iodine, betadine--due to shellfish allergy  Selling, throat closes   Penicillins Anaphylaxis, Swelling and Rash    Has patient had a PCN reaction causing immediate rash, facial/tongue/throat swelling, SOB or lightheadedness with hypotension: Yes Has patient had a PCN reaction causing severe rash involving mucus membranes or skin necrosis: Unknown Has patient had a PCN reaction that required hospitalization: No Has patient had a PCN reaction occurring within the last 10 years: Yes If all of the above answers are "NO", then may proceed with Cephalosporin use.    Shellfish Allergy Shortness Of Breath    Selling, throat closes   Iodinated Diagnostic Agents Other (See Comments)    Iodine, betadine--due to shellfish allergy  Other reaction(s): Other (See Comments) Iodine, betadine--due to shellfish allergy  Other reaction(s): Other (See Comments) Iodine, betadine--due to shellfish allergy     Levaquin [Levofloxacin In D5w] Other (See Comments)    Patient cannot remember reaction.   Lodine [Etodolac] Palpitations    Family History: Family History  Problem Relation Age of Onset   Hyperlipidemia Father    Hypertension Father    Hypertension Sister    Heart disease Paternal Grandfather    Breast cancer Neg Hx    Stomach cancer Neg Hx    Esophageal cancer Neg Hx    Colon cancer Neg Hx     Social History:  reports that she has never smoked. She has never used smokeless tobacco. She reports that she does not drink alcohol and does not use drugs.  ROS: All other review of systems were reviewed and are negative  except what is noted above in HPI  Physical Exam: BP 111/73    Pulse 77    Temp 98.8 F (37.1 C)    Ht 5\' 5"  (1.651 m)    Wt 203 lb (92.1 kg)    BMI 33.78 kg/m   Constitutional:  Alert and oriented, No acute distress. HEENT: [place] AT, moist mucus membranes.  Trachea midline, no masses. Cardiovascular: No clubbing, cyanosis, or edema. Respiratory: Normal respiratory effort, no increased work of breathing. GI: Abdomen is soft, nontender, nondistended, no abdominal masses GU: No CVA tenderness.  Lymph: No cervical or inguinal lymphadenopathy. Skin: No rashes, bruises or suspicious lesions. Neurologic: Grossly intact, no focal deficits, moving all 4 extremities. Psychiatric: Normal mood and affect.  Laboratory Data: Lab Results  Component Value Date   WBC 7.5 07/17/2017   HGB 11.5 (L) 07/17/2017   HCT 34.9 (L) 07/17/2017   MCV 93.3 07/17/2017   PLT 283 07/17/2017    Lab Results  Component Value Date   CREATININE 0.53 07/17/2017    No results found for: PSA  No results found for: TESTOSTERONE  Lab Results  Component Value Date   HGBA1C 4.8 05/30/2016    Urinalysis    Component Value Date/Time   COLORURINE YELLOW 11/13/2010 2156   APPEARANCEUR Clear 12/14/2019 0922   LABSPEC 1.013 11/13/2010 2156   PHURINE 6.5 11/13/2010 2156   GLUCOSEU Negative 12/14/2019 0922   HGBUR NEGATIVE 11/13/2010 2156   BILIRUBINUR Negative 12/14/2019 0922   KETONESUR NEGATIVE 11/13/2010 2156   PROTEINUR Negative 12/14/2019 0922   PROTEINUR NEGATIVE 11/13/2010 2156   UROBILINOGEN negative (A) 10/07/2019 1524   UROBILINOGEN 1.0 11/13/2010 2156   NITRITE Negative 12/14/2019 0922   NITRITE NEGATIVE 11/13/2010 2156   LEUKOCYTESUR Negative 12/14/2019 0922    Lab Results  Component Value Date   LABMICR Comment 12/14/2019   MUCUS negative 09/06/2012   BACTERIA many 09/06/2012    Pertinent Imaging: CT stone study 12/02/2019: Images reviewed and discussed with the patient Results for orders  placed in visit on 07/11/14  DG Abd 1 View  Narrative CLINICAL DATA:  Three-week history.  Lower abdominal pain.  EXAM: ABDOMEN - 1 VIEW  COMPARISON:  None.  FINDINGS: The bowel gas pattern is normal. No radio-opaque calculi or other significant radiographic abnormality are seen. Moderate stool burden hepatic flexure. Scoliosis. There appears to be a small surgical clip lying within or overlying the abdomen projecting to the LEFT of L3-L4. This probably relates to prior abdominal surgery.  IMPRESSION: No acute intra-abdominal findings.   Electronically Signed By: Rolla Flatten M.D. On: 07/11/2014 11:21  No results found for this or any previous visit.  No results found for this or any previous visit.  No results found for this or any previous visit.  No results found for this or any previous visit.  No results found for this or any previous visit.  No results found for this or any previous visit.  Results for orders placed during the hospital encounter of 12/02/19  CT RENAL STONE STUDY  Narrative CLINICAL DATA:  49 year old female with history of left-sided flank pain for the past 2-3 months.  EXAM: CT ABDOMEN AND PELVIS WITHOUT CONTRAST  TECHNIQUE: Multidetector CT imaging of the abdomen and pelvis was performed following the standard protocol without IV contrast.  COMPARISON:  CT the abdomen and pelvis 11/14/2010.  FINDINGS: Lower chest: Moderate-sized hiatal hernia.  Hepatobiliary: 2.8 x 2.4 cm low-attenuation lesion in the central aspect of segment 3 of the liver, incompletely characterized on today's non-contrast CT examination, but favored to represent a cyst. Status post cholecystectomy.  Pancreas: No definite pancreatic mass or peripancreatic fluid collections or inflammatory changes are noted on today's noncontrast CT examination.  Spleen: Unremarkable.  Adrenals/Urinary Tract: There are no abnormal calcifications within the collecting  system of either kidney, along the course of either ureter, or within the lumen of the urinary bladder. No hydroureteronephrosis or perinephric stranding to suggest urinary tract obstruction at this time. The unenhanced appearance of the kidneys is unremarkable bilaterally. Unenhanced appearance of the urinary bladder is normal. Bilateral adrenal glands are normal in appearance.  Stomach/Bowel: Unenhanced appearance of the stomach is normal. No pathologic dilatation of small bowel or colon. The appendix is not confidently identified and may be surgically absent. Regardless, there are no inflammatory changes noted adjacent to the cecum to suggest the presence of an acute appendicitis at this time.  Vascular/Lymphatic: Aortic atherosclerosis. No lymphadenopathy noted in the abdomen or pelvis.  Reproductive: Status post hysterectomy. Ovaries are not confidently identified may be surgically absent or  atrophic.  Other: Tiny umbilical hernia containing only omental fat. No significant volume of ascites. No pneumoperitoneum.  Musculoskeletal: There are no aggressive appearing lytic or blastic lesions noted in the visualized portions of the skeleton.  IMPRESSION: 1. No acute findings are noted in the abdomen or pelvis to account for the patient's symptoms. Specifically, no urinary tract calculi no findings of urinary tract obstruction are noted at this time. 2. Tiny umbilical hernia containing only omental fat. No associated bowel incarceration or obstruction at this time. 3. Aortic atherosclerosis. 4. Moderate-sized hiatal hernia. 5. Additional incidental findings, as above   Electronically Signed By: Vinnie Langton M.D. On: 12/02/2019 08:17   Assessment & Plan:    1. Personal history of urinary infection -UA today is normal - Urinalysis, Routine w reflex microscopic  2. Dysuria -We will trial toviaz 4mg  daily   No follow-ups on file.  Nicolette Bang, MD  [facility] [place]

## 2019-12-14 NOTE — Patient Instructions (Signed)

## 2019-12-14 NOTE — Addendum Note (Signed)
Addended by: Dorisann Frames on: 12/14/2019 10:13 AM   Modules accepted: Orders

## 2020-01-09 ENCOUNTER — Encounter: Payer: Self-pay | Admitting: Gastroenterology

## 2020-01-09 ENCOUNTER — Ambulatory Visit: Payer: Federal, State, Local not specified - PPO | Admitting: Gastroenterology

## 2020-01-09 VITALS — BP 122/77 | HR 73 | Ht 63.0 in | Wt 193.4 lb

## 2020-01-09 DIAGNOSIS — K449 Diaphragmatic hernia without obstruction or gangrene: Secondary | ICD-10-CM | POA: Diagnosis not present

## 2020-01-09 DIAGNOSIS — R1310 Dysphagia, unspecified: Secondary | ICD-10-CM | POA: Insufficient documentation

## 2020-01-09 DIAGNOSIS — K222 Esophageal obstruction: Secondary | ICD-10-CM | POA: Insufficient documentation

## 2020-01-09 DIAGNOSIS — Z1211 Encounter for screening for malignant neoplasm of colon: Secondary | ICD-10-CM | POA: Diagnosis not present

## 2020-01-09 DIAGNOSIS — R142 Eructation: Secondary | ICD-10-CM | POA: Insufficient documentation

## 2020-01-09 MED ORDER — SUTAB 1479-225-188 MG PO TABS: 1.0000 | ORAL_TABLET | Freq: Once | ORAL | 0 refills | Status: AC

## 2020-01-09 NOTE — Patient Instructions (Addendum)
If you are age 47 or older, your body mass index should be between 23-30. Your Body mass index is 34.26 kg/m. If this is out of the aforementioned range listed, please consider follow up with your Primary Care Provider.  If you are age 21 or younger, your body mass index should be between 19-25. Your Body mass index is 34.26 kg/m. If this is out of the aformentioned range listed, please consider follow up with your Primary Care Provider.   [facility] for an appointment.   You have been scheduled for an endoscopy. Please follow written instructions given to you at your visit today. If you use inhalers (even only as needed), please bring them with you on the day of your procedure.  You have been scheduled for a colonoscopy. Please follow written instructions given to you at your visit today.  Please pick up your prep supplies at the pharmacy within the next 1-3 days. If you use inhalers (even only as needed), please bring them with you on the day of your procedure.  Due to recent changes in healthcare laws, you may see the results of your imaging and laboratory studies on MyChart before your provider has had a chance to review them.  We understand that in some cases there may be results that are confusing or concerning to you. Not all laboratory results come back in the same time frame and the provider may be waiting for multiple results in order to interpret others.  Please give [place] 48 hours in order for your provider to thoroughly review all the results before contacting the office for clarification of your results.

## 2020-01-09 NOTE — Progress Notes (Signed)
01/09/2020 Emily Johns [id number] 11-28-1970   HISTORY OF PRESENT ILLNESS: This is a 49 year old female who is a patient of Dr. Vena Rua.  She has history of Schatzki's ring that has required dilation in the past and also had a large hiatal hernia for which she underwent a hiatal hernia repair in the form of Nissen fundoplication by Dr. Kieth Brightly in 2019.  She says that recently over the past 4 months or so she has been having a lot of belching quite regularly and waking up with soreness in her throat.  She says that often times it feels like there is sloshing or something moving around and sometimes it hurts underneath her ribs.  She had a CT scan performed by urology for evaluation of renal stones, which showed a moderate size hiatal hernia.  She is here to discuss this.  She says that sometimes she has trouble swallowing pills.  Last EGD was October 2018 at which time she had her set Schatzki's ring dilated.  This was before her hiatal hernia repair.  She's never had colonoscopy in the past.  She admits to some mild constipation.  No rectal bleeding.  She would like to proceed with scheduling colonoscopy as well.  Past Medical History:  Diagnosis Date  . Arthritis   . Asthma   . Hiatal hernia   . Hyperlipemia   . PONV (postoperative nausea and vomiting)   . Schatzki's ring   . Vitamin D deficiency    Past Surgical History:  Procedure Laterality Date  . ABDOMINAL HYSTERECTOMY     partial  . APPENDECTOMY    . CESAREAN SECTION     x3  . CHOLECYSTECTOMY    . EXPLORATORY LAPAROTOMY    . HERNIA REPAIR  2019  . LAPAROSCOPIC LYSIS OF ADHESIONS  05/06/2012   Procedure: LAPAROSCOPIC LYSIS OF ADHESIONS;  Surgeon: Cheri Fowler, MD;  Location: [facility];  Service: Gynecology;  Laterality: N/A;  . LAPAROSCOPIC NISSEN FUNDOPLICATION N/A 07/07/8117   Procedure: LAPAROSCOPIC hiatal hernia repair with fundoplication;  Surgeon: Kieth Brightly Arta Bruce, MD;  Location: [facility];  Service: General;   Laterality: N/A;  . LAPAROSCOPY  05/06/2012   Procedure: LAPAROSCOPY OPERATIVE;  Surgeon: Cheri Fowler, MD;  Location: [facility];  Service: Gynecology;  Laterality: N/A;  . SALPINGOOPHORECTOMY  05/06/2012   Procedure: SALPINGO OOPHORECTOMY;  Surgeon: Cheri Fowler, MD;  Location: [facility];  Service: Gynecology;  Laterality: Left;    reports that she has never smoked. She has never used smokeless tobacco. She reports that she does not drink alcohol and does not use drugs. family history includes Heart disease in her paternal grandfather; Hyperlipidemia in her father; Hypertension in her father and sister. Allergies  Allergen Reactions  . Other Shortness Of Breath and Other (See Comments)    Selling, throat closes Iodine, betadine--due to shellfish allergy  Selling, throat closes  . Penicillins Anaphylaxis, Swelling and Rash    Has patient had a PCN reaction causing immediate rash, facial/tongue/throat swelling, SOB or lightheadedness with hypotension: Yes Has patient had a PCN reaction causing severe rash involving mucus membranes or skin necrosis: Unknown Has patient had a PCN reaction that required hospitalization: No Has patient had a PCN reaction occurring within the last 10 years: Yes If all of the above answers are "NO", then may proceed with Cephalosporin use.   . Shellfish Allergy Shortness Of Breath    Selling, throat closes  . Iodinated Diagnostic Agents Other (See Comments)    Iodine, betadine--due  to shellfish allergy  Other reaction(s): Other (See Comments) Iodine, betadine--due to shellfish allergy  Other reaction(s): Other (See Comments) Iodine, betadine--due to shellfish allergy    . Levaquin [Levofloxacin In D5w] Other (See Comments)    Patient cannot remember reaction.  . Lodine [Etodolac] Palpitations      Outpatient Encounter Medications as of 01/09/2020  Medication Sig  . albuterol (VENTOLIN HFA) 108 (90 Base) MCG/ACT inhaler Inhale into the lungs.  . fluticasone  (FLONASE) 50 MCG/ACT nasal spray Place 2 sprays into both nostrils daily.  Marland Kitchen ibuprofen (ADVIL,MOTRIN) 200 MG tablet Take 400-600 mg by mouth every 8 (eight) hours as needed (for pain.).   . [DISCONTINUED] fesoterodine (TOVIAZ) 4 MG TB24 tablet Take 1 tablet (4 mg total) by mouth daily. (Patient not taking: Reported on 01/09/2020)  . [DISCONTINUED] FIBER PO Take 1-2 tablets by mouth daily.  (Patient not taking: Reported on 01/09/2020)  . [DISCONTINUED] gabapentin (NEURONTIN) 300 MG capsule Take 1 capsule (300 mg total) by mouth 2 (two) times daily. (Patient not taking: Reported on 12/14/2019)  . [DISCONTINUED] HYDROcodone-acetaminophen (NORCO/VICODIN) 5-325 MG tablet Take 1-2 tablets by mouth every 6 (six) hours as needed for moderate pain. (Patient not taking: Reported on 12/14/2019)  . [DISCONTINUED] hydrocortisone (ANUSOL-HC) 25 MG suppository Use 1 suppository at bedtime. (Patient not taking: Reported on 12/14/2019)  . [DISCONTINUED] Meth-Hyo-M Bl-Na Phos-Ph Sal (URIBEL) 118 MG CAPS Take 1 capsule (118 mg total) by mouth 4 (four) times daily as needed (burning). (Patient not taking: Reported on 01/09/2020)  . [DISCONTINUED] nitrofurantoin, macrocrystal-monohydrate, (MACROBID) 100 MG capsule Take 1 capsule (100 mg total) by mouth every 12 (twelve) hours. (Patient not taking: Reported on 01/09/2020)  . [DISCONTINUED] ondansetron (ZOFRAN-ODT) 4 MG disintegrating tablet Take 1 tablet (4 mg total) by mouth every 6 (six) hours as needed for nausea. (Patient not taking: Reported on 12/14/2019)  . [DISCONTINUED] pantoprazole (PROTONIX) 40 MG tablet 40 mg daily -30 minutes before first meal of the day (Patient not taking: Reported on 12/14/2019)   No facility-administered encounter medications on file as of 01/09/2020.     REVIEW OF SYSTEMS  : All other systems reviewed and negative except where noted in the History of Present Illness.   PHYSICAL EXAM: BP 122/77   Pulse 73   Ht 5\' 3"  (1.6 m)   Wt 193 lb  6.4 oz (87.7 kg)   SpO2 99%   BMI 34.26 kg/m  General: Well developed white female in no acute distress Head: Normocephalic and atraumatic Eyes:  Sclerae anicteric, conjunctiva pink. Ears: Normal auditory acuity Lungs: Clear throughout to auscultation; no W/R/R. Heart: Regular rate and rhythm; no M/R/G. Abdomen: Soft, non-distended.  BS present.  Non-tender. Rectal:  Will be done at the time of colonoscopy. Musculoskeletal: Symmetrical with no gross deformities  Skin: No lesions on visible extremities Extremities: No edema  Neurological: Alert oriented x 4, grossly non-focal Psychological:  Alert and cooperative. Normal mood and affect  ASSESSMENT AND PLAN: *49 year old female with complaints of dysphagia with history of Schatzki's ring requiring dilation, belching, history of large hiatal hernia for which she had surgical repair in 2019, but recent imaging shows that has recurred.  We will plan for EGD with possible dilation with Dr. Hilarie Fredrickson and to reassess anatomy, etc.  I have also advised her to make an appointment with her surgeon, Dr. Kieth Brightly, to discuss this recurrence as well. *Screening for CRC:  Will plan for colonoscopy with Dr. Hilarie Fredrickson as well.  **The risks, benefits, and alternatives to EGD  and colonoscopy were discussed with the patient and she consents to proceed.   CC:  [facility], [facility]

## 2020-01-18 ENCOUNTER — Ambulatory Visit (INDEPENDENT_AMBULATORY_CARE_PROVIDER_SITE_OTHER): Payer: Federal, State, Local not specified - PPO | Admitting: Urology

## 2020-01-18 ENCOUNTER — Encounter: Payer: Self-pay | Admitting: Urology

## 2020-01-18 ENCOUNTER — Other Ambulatory Visit: Payer: Self-pay

## 2020-01-18 VITALS — BP 108/69 | HR 61 | Temp 98.0°F | Ht 63.0 in | Wt 193.4 lb

## 2020-01-18 DIAGNOSIS — R102 Pelvic and perineal pain: Secondary | ICD-10-CM | POA: Diagnosis not present

## 2020-01-18 DIAGNOSIS — R300 Dysuria: Secondary | ICD-10-CM | POA: Diagnosis not present

## 2020-01-18 LAB — URINALYSIS, ROUTINE W REFLEX MICROSCOPIC
Bilirubin, UA: NEGATIVE
Glucose, UA: NEGATIVE
Ketones, UA: NEGATIVE
Leukocytes,UA: NEGATIVE
Nitrite, UA: NEGATIVE
Protein,UA: NEGATIVE
RBC, UA: NEGATIVE
Specific Gravity, UA: 1.025 (ref 1.005–1.030)
Urobilinogen, Ur: 0.2 mg/dL (ref 0.2–1.0)
pH, UA: 5.5 (ref 5.0–7.5)

## 2020-01-18 MED ORDER — ELMIRON 100 MG PO CAPS: 100.0000 mg | ORAL_CAPSULE | Freq: Three times a day (TID) | ORAL | 11 refills | Status: DC

## 2020-01-18 MED ORDER — HYDROXYZINE HCL 10 MG PO TABS: 10.0000 mg | ORAL_TABLET | Freq: Every day | ORAL | 0 refills | Status: DC

## 2020-01-18 MED ORDER — AMITRIPTYLINE HCL 10 MG PO TABS: 10.0000 mg | ORAL_TABLET | Freq: Every day | ORAL | 11 refills | Status: DC

## 2020-01-18 NOTE — Progress Notes (Signed)
01/18/2020 11:28 AM   Emily Johns 1970-07-06 [id number]  Referring provider: [facility] [address] [place],  [place] [address]  Dysuria and pelvic pain  HPI: Emily Johns is a 49yo here for followup for dysuria and pelvic pain. She was started on toviaz last visit which only slightly improved her pelvic pain. She continues to have intermittent pelvic pain with urination. Pain improves 30-60 minutes after urinating. No worsening urinary frequency, urgency or incontinence. She is scheduled to having endoscopy tomorrow   PMH: Past Medical History:  Diagnosis Date   Arthritis    Asthma    Hiatal hernia    Hyperlipemia    PONV (postoperative nausea and vomiting)    Schatzki's ring    Vitamin D deficiency     Surgical History: Past Surgical History:  Procedure Laterality Date   ABDOMINAL HYSTERECTOMY     partial   APPENDECTOMY     CESAREAN SECTION     x3   CHOLECYSTECTOMY     EXPLORATORY LAPAROTOMY     HERNIA REPAIR  2019   LAPAROSCOPIC LYSIS OF ADHESIONS  05/06/2012   Procedure: LAPAROSCOPIC LYSIS OF ADHESIONS;  Surgeon: Cheri Fowler, MD;  Location: [facility];  Service: Gynecology;  Laterality: N/A;   LAPAROSCOPIC NISSEN FUNDOPLICATION N/A [id number]/79/3903   Procedure: LAPAROSCOPIC hiatal hernia repair with fundoplication;  Surgeon: Kieth Brightly Arta Bruce, MD;  Location: [facility];  Service: General;  Laterality: N/A;   LAPAROSCOPY  05/06/2012   Procedure: LAPAROSCOPY OPERATIVE;  Surgeon: Cheri Fowler, MD;  Location: [facility];  Service: Gynecology;  Laterality: N/A;   SALPINGOOPHORECTOMY  05/06/2012   Procedure: SALPINGO OOPHORECTOMY;  Surgeon: Cheri Fowler, MD;  Location: [facility];  Service: Gynecology;  Laterality: Left;    Home Medications:  Allergies as of 01/18/2020      Reactions   Other Shortness Of Breath, Other (See Comments)   Selling, throat closes Iodine, betadine--due to shellfish allergy  Selling, throat closes    Penicillins Anaphylaxis, Swelling, Rash   Has patient had a PCN reaction causing immediate rash, facial/tongue/throat swelling, SOB or lightheadedness with hypotension: Yes Has patient had a PCN reaction causing severe rash involving mucus membranes or skin necrosis: Unknown Has patient had a PCN reaction that required hospitalization: No Has patient had a PCN reaction occurring within the last 10 years: Yes If all of the above answers are "NO", then may proceed with Cephalosporin use.   Shellfish Allergy Shortness Of Breath   Selling, throat closes   Iodinated Diagnostic Agents Other (See Comments)   Iodine, betadine--due to shellfish allergy  Other reaction(s): Other (See Comments) Iodine, betadine--due to shellfish allergy  Other reaction(s): Other (See Comments) Iodine, betadine--due to shellfish allergy    Levaquin [levofloxacin In D5w] Other (See Comments)   Patient cannot remember reaction.   Lodine [etodolac] Palpitations      Medication List       Accurate as of January 18, 2020 11:28 AM. If you have any questions, ask your nurse or doctor.        albuterol 108 (90 Base) MCG/ACT inhaler Commonly known as: VENTOLIN HFA Inhale into the lungs.   fluticasone 50 MCG/ACT nasal spray Commonly known as: FLONASE Place 2 sprays into both nostrils daily.   ibuprofen 200 MG tablet Commonly known as: ADVIL Take 400-600 mg by mouth every 8 (eight) hours as needed (for pain.).       Allergies:  Allergies  Allergen Reactions   Other Shortness Of Breath and  Other (See Comments)    Selling, throat closes Iodine, betadine--due to shellfish allergy  Selling, throat closes   Penicillins Anaphylaxis, Swelling and Rash    Has patient had a PCN reaction causing immediate rash, facial/tongue/throat swelling, SOB or lightheadedness with hypotension: Yes Has patient had a PCN reaction causing severe rash involving mucus membranes or skin necrosis: Unknown Has patient had a PCN  reaction that required hospitalization: No Has patient had a PCN reaction occurring within the last 10 years: Yes If all of the above answers are "NO", then may proceed with Cephalosporin use.    Shellfish Allergy Shortness Of Breath    Selling, throat closes   Iodinated Diagnostic Agents Other (See Comments)    Iodine, betadine--due to shellfish allergy  Other reaction(s): Other (See Comments) Iodine, betadine--due to shellfish allergy  Other reaction(s): Other (See Comments) Iodine, betadine--due to shellfish allergy     Levaquin [Levofloxacin In D5w] Other (See Comments)    Patient cannot remember reaction.   Lodine [Etodolac] Palpitations    Family History: Family History  Problem Relation Age of Onset   Hyperlipidemia Father    Hypertension Father    Hypertension Sister    Heart disease Paternal Grandfather    Breast cancer Neg Hx    Stomach cancer Neg Hx    Esophageal cancer Neg Hx    Colon cancer Neg Hx     Social History:  reports that she has never smoked. She has never used smokeless tobacco. She reports that she does not drink alcohol and does not use drugs.  ROS: All other review of systems were reviewed and are negative except what is noted above in HPI  Physical Exam: BP 108/69    Pulse 61    Temp 98 F (36.7 C)    Ht 5\' 3"  (1.6 m)    Wt 193 lb 6.4 oz (87.7 kg)    BMI 34.26 kg/m   Constitutional:  Alert and oriented, No acute distress. HEENT: [place] AT, moist mucus membranes.  Trachea midline, no masses. Cardiovascular: No clubbing, cyanosis, or edema. Respiratory: Normal respiratory effort, no increased work of breathing. GI: Abdomen is soft, nontender, nondistended, no abdominal masses GU: No CVA tenderness.  Lymph: No cervical or inguinal lymphadenopathy. Skin: No rashes, bruises or suspicious lesions. Neurologic: Grossly intact, no focal deficits, moving all 4 extremities. Psychiatric: Normal mood and affect.  Laboratory Data: Lab Results    Component Value Date   WBC 7.5 07/17/2017   HGB 11.5 (L) 07/17/2017   HCT 34.9 (L) 07/17/2017   MCV 93.3 07/17/2017   PLT 283 07/17/2017    Lab Results  Component Value Date   CREATININE 0.53 07/17/2017    No results found for: PSA  No results found for: TESTOSTERONE  Lab Results  Component Value Date   HGBA1C 4.8 05/30/2016    Urinalysis    Component Value Date/Time   COLORURINE YELLOW 11/13/2010 2156   APPEARANCEUR Clear 12/14/2019 0922   LABSPEC 1.013 11/13/2010 2156   PHURINE 6.5 11/13/2010 2156   GLUCOSEU Negative 12/14/2019 0922   HGBUR NEGATIVE 11/13/2010 2156   BILIRUBINUR Negative 12/14/2019 0922   KETONESUR NEGATIVE 11/13/2010 2156   PROTEINUR Negative 12/14/2019 0922   PROTEINUR NEGATIVE 11/13/2010 2156   UROBILINOGEN negative (A) 10/07/2019 1524   UROBILINOGEN 1.0 11/13/2010 2156   NITRITE Negative 12/14/2019 0922   NITRITE NEGATIVE 11/13/2010 2156   LEUKOCYTESUR Negative 12/14/2019 0922    Lab Results  Component Value Date   LABMICR Comment 12/14/2019  MUCUS negative 09/06/2012   BACTERIA many 09/06/2012    Pertinent Imaging:  Results for orders placed in visit on 07/11/14  DG Abd 1 View  Narrative CLINICAL DATA:  Three-week history.  Lower abdominal pain.  EXAM: ABDOMEN - 1 VIEW  COMPARISON:  None.  FINDINGS: The bowel gas pattern is normal. No radio-opaque calculi or other significant radiographic abnormality are seen. Moderate stool burden hepatic flexure. Scoliosis. There appears to be a small surgical clip lying within or overlying the abdomen projecting to the LEFT of L3-L4. This probably relates to prior abdominal surgery.  IMPRESSION: No acute intra-abdominal findings.   Electronically Signed By: Rolla Flatten M.D. On: 07/11/2014 11:21  No results found for this or any previous visit.  No results found for this or any previous visit.  No results found for this or any previous visit.  No results found for this  or any previous visit.  No results found for this or any previous visit.  No results found for this or any previous visit.  Results for orders placed during the hospital encounter of 12/02/19  CT RENAL STONE STUDY  Narrative CLINICAL DATA:  49 year old female with history of left-sided flank pain for the past 2-3 months.  EXAM: CT ABDOMEN AND PELVIS WITHOUT CONTRAST  TECHNIQUE: Multidetector CT imaging of the abdomen and pelvis was performed following the standard protocol without IV contrast.  COMPARISON:  CT the abdomen and pelvis 11/14/2010.  FINDINGS: Lower chest: Moderate-sized hiatal hernia.  Hepatobiliary: 2.8 x 2.4 cm low-attenuation lesion in the central aspect of segment 3 of the liver, incompletely characterized on today's non-contrast CT examination, but favored to represent a cyst. Status post cholecystectomy.  Pancreas: No definite pancreatic mass or peripancreatic fluid collections or inflammatory changes are noted on today's noncontrast CT examination.  Spleen: Unremarkable.  Adrenals/Urinary Tract: There are no abnormal calcifications within the collecting system of either kidney, along the course of either ureter, or within the lumen of the urinary bladder. No hydroureteronephrosis or perinephric stranding to suggest urinary tract obstruction at this time. The unenhanced appearance of the kidneys is unremarkable bilaterally. Unenhanced appearance of the urinary bladder is normal. Bilateral adrenal glands are normal in appearance.  Stomach/Bowel: Unenhanced appearance of the stomach is normal. No pathologic dilatation of small bowel or colon. The appendix is not confidently identified and may be surgically absent. Regardless, there are no inflammatory changes noted adjacent to the cecum to suggest the presence of an acute appendicitis at this time.  Vascular/Lymphatic: Aortic atherosclerosis. No lymphadenopathy noted in the abdomen or  pelvis.  Reproductive: Status post hysterectomy. Ovaries are not confidently identified may be surgically absent or atrophic.  Other: Tiny umbilical hernia containing only omental fat. No significant volume of ascites. No pneumoperitoneum.  Musculoskeletal: There are no aggressive appearing lytic or blastic lesions noted in the visualized portions of the skeleton.  IMPRESSION: 1. No acute findings are noted in the abdomen or pelvis to account for the patient's symptoms. Specifically, no urinary tract calculi no findings of urinary tract obstruction are noted at this time. 2. Tiny umbilical hernia containing only omental fat. No associated bowel incarceration or obstruction at this time. 3. Aortic atherosclerosis. 4. Moderate-sized hiatal hernia. 5. Additional incidental findings, as above   Electronically Signed By: Vinnie Langton M.D. On: 12/02/2019 08:17   Assessment & Plan:    1. dysuira and pelvic pain -We will trial IC medications elmiron, atarax, and elavil. RTC 3 months - Urinalysis, Routine w reflex microscopic  No follow-ups on file.  Nicolette Bang, MD  [facility] [place]

## 2020-01-18 NOTE — Patient Instructions (Signed)
Pelvic Pain, Female Pelvic pain is pain in your lower belly (abdomen), below your belly button and between your hips. The pain may start suddenly (be acute), keep coming back (be recurring), or last a long time (become chronic). Pelvic pain that lasts longer than 6 months is called chronic pelvic pain. There are many causes of pelvic pain. Sometimes the cause of pelvic pain is not known. Follow these instructions at home:   Take over-the-counter and prescription medicines only as told by your doctor.  Rest as told by your doctor.  Do not have sex if it hurts.  Keep a journal of your pelvic pain. Write down: ? When the pain started. ? Where the pain is located. ? What seems to make the pain better or worse, such as food or your period (menstrual cycle). ? Any symptoms you have along with the pain.  Keep all follow-up visits as told by your doctor. This is important. Contact a doctor if:  Medicine does not help your pain.  Your pain comes back.  You have new symptoms.  You have unusual discharge or bleeding from your vagina.  You have a fever or chills.  You are having trouble pooping (constipation).  You have blood in your pee (urine) or poop (stool).  Your pee smells bad.  You feel weak or light-headed. Get help right away if:  You have sudden pain that is very bad.  Your pain keeps getting worse.  You have very bad pain and also have any of these symptoms: ? A fever. ? Feeling sick to your stomach (nausea). ? Throwing up (vomiting). ? Being very sweaty.  You pass out (lose consciousness). Summary  Pelvic pain is pain in your lower belly (abdomen), below your belly button and between your hips.  There are many possible causes of pelvic pain.  Keep a journal of your pelvic pain. This information is not intended to replace advice given to you by your health care provider. Make sure you discuss any questions you have with your health care provider. Document  Revised: 09/02/2017 Document Reviewed: 09/02/2017 Elsevier Patient Education  2020 Elsevier Inc.  

## 2020-01-18 NOTE — Progress Notes (Signed)
Urological Symptom Review  Patient is experiencing the following symptoms: Burning/pain with urination Painful intercourse   Review of Systems  Gastrointestinal (upper)  : Negative for upper GI symptoms  Gastrointestinal (lower) : Negative for lower GI symptoms  Constitutional : Negative for symptoms  Skin: Negative for skin symptoms  Eyes: Sinus problems  Ear/Nose/Throat : Sinus problems  Hematologic/Lymphatic: Negative for Hematologic/Lymphatic symptoms  Cardiovascular : Negative for cardiovascular symptoms  Respiratory : Negative for respiratory symptoms  Endocrine: Negative for endocrine symptoms  Musculoskeletal: Negative for musculoskeletal symptoms  Neurological: Negative for neurological symptoms  Psychologic: Negative for psychiatric symptoms

## 2020-01-19 ENCOUNTER — Encounter: Payer: Self-pay | Admitting: Internal Medicine

## 2020-01-19 ENCOUNTER — Ambulatory Visit (AMBULATORY_SURGERY_CENTER): Payer: Federal, State, Local not specified - PPO | Admitting: Internal Medicine

## 2020-01-19 VITALS — BP 112/70 | HR 70 | Temp 98.0°F | Resp 19 | Ht 63.0 in | Wt 193.0 lb

## 2020-01-19 DIAGNOSIS — R1310 Dysphagia, unspecified: Secondary | ICD-10-CM | POA: Diagnosis not present

## 2020-01-19 DIAGNOSIS — K449 Diaphragmatic hernia without obstruction or gangrene: Secondary | ICD-10-CM | POA: Diagnosis not present

## 2020-01-19 DIAGNOSIS — K222 Esophageal obstruction: Secondary | ICD-10-CM

## 2020-01-19 DIAGNOSIS — R142 Eructation: Secondary | ICD-10-CM | POA: Diagnosis not present

## 2020-01-19 MED ORDER — SODIUM CHLORIDE 0.9 % IV SOLN: 500.0000 mL | Freq: Once | INTRAVENOUS | Status: DC

## 2020-01-19 MED ORDER — PANTOPRAZOLE SODIUM 40 MG PO TBEC: 40.0000 mg | DELAYED_RELEASE_TABLET | Freq: Every day | ORAL | 2 refills | Status: AC

## 2020-01-19 NOTE — Progress Notes (Signed)
Addendum: Reviewed and agree with assessment and management plan. Mayrani Khamis M, MD  

## 2020-01-19 NOTE — Patient Instructions (Signed)
Handout given for Hiatal Hernia.   YOU HAD AN ENDOSCOPIC PROCEDURE TODAY AT [facility]:   Refer to the procedure report that was given to you for any specific questions about what was found during the examination.  If the procedure report does not answer your questions, please call your gastroenterologist to clarify.  If you requested that your care partner not be given the details of your procedure findings, then the procedure report has been included in a sealed envelope for you to review at your convenience later.  YOU SHOULD EXPECT: Some feelings of bloating in the abdomen. Passage of more gas than usual.  Walking can help get rid of the air that was put into your GI tract during the procedure and reduce the bloating. If you had a lower endoscopy (such as a colonoscopy or flexible sigmoidoscopy) you may notice spotting of blood in your stool or on the toilet paper. If you underwent a bowel prep for your procedure, you may not have a normal bowel movement for a few days.  Please Note:  You might notice some irritation and congestion in your nose or some drainage.  This is from the oxygen used during your procedure.  There is no need for concern and it should clear up in a day or so.  SYMPTOMS TO REPORT IMMEDIATELY:   Following upper endoscopy (EGD)  Vomiting of blood or coffee ground material  New chest pain or pain under the shoulder blades  Painful or persistently difficult swallowing  New shortness of breath  Fever of 100F or higher  Black, tarry-looking stools  For urgent or emergent issues, a gastroenterologist can be reached at any hour by calling [phone]. Do not use MyChart messaging for urgent concerns.    DIET:  We do recommend a small meal at first, but then you may proceed to your regular diet.  Drink plenty of fluids but you should avoid alcoholic beverages for 24 hours.  ACTIVITY:  You should plan to take it easy for the rest of today and you should  NOT DRIVE or use heavy machinery until tomorrow (because of the sedation medicines used during the test).    FOLLOW UP: Our staff will call the number listed on your records 48-72 hours following your procedure to check on you and address any questions or concerns that you may have regarding the information given to you following your procedure. If we do not reach you, we will leave a message.  We will attempt to reach you two times.  During this call, we will ask if you have developed any symptoms of COVID 19. If you develop any symptoms (ie: fever, flu-like symptoms, shortness of breath, cough etc.) before then, please call [phone].  If you test positive for Covid 19 in the 2 weeks post procedure, please call and report this information to [place].    If any biopsies were taken you will be contacted by phone or by letter within the next 1-3 weeks.  Please call us at [phone] if you have not heard about the biopsies in 3 weeks.    SIGNATURES/CONFIDENTIALITY: You and/or your care partner have signed paperwork which will be entered into your electronic medical record.  These signatures attest to the fact that that the information above on your After Visit Summary has been reviewed and is understood.  Full responsibility of the confidentiality of this discharge information lies with you and/or your care-partner.

## 2020-01-19 NOTE — Progress Notes (Signed)
Called to room to assist during endoscopic procedure.  Patient ID and intended procedure confirmed with present staff. Received instructions for my participation in the procedure from the performing physician.  

## 2020-01-19 NOTE — Op Note (Signed)
[facility] Patient Name: Emily Johns Procedure Date: 01/19/2020 10:13 AM MRN: [id number] Endoscopist: Jerene Bears , MD Age: 49 Referring MD:  Date of Birth: 1971/01/20 Gender: Female Account #: [id number] Procedure:                Upper GI endoscopy Indications:              Epigastric abdominal pain, Dysphagia, Eructation,                            history of large hiatal hernia and Schatzki's ring                            s/p EGD with dilation to 15.5 mm in Oct 2018                            followed by laparoscopic hiatal hernia repair and                            Nissen fundoplication in April 3903, recent CT abd                            suggesting recurrence of hiatal hernia Medicines:                Monitored Anesthesia Care Procedure:                Pre-Anesthesia Assessment:                           - Prior to the procedure, a History and Physical                            was performed, and patient medications and                            allergies were reviewed. The patient's tolerance of                            previous anesthesia was also reviewed. The risks                            and benefits of the procedure and the sedation                            options and risks were discussed with the patient.                            All questions were answered, and informed consent                            was obtained. Prior Anticoagulants: The patient has                            taken no previous anticoagulant or antiplatelet  agents. ASA Grade Assessment: II - A patient with                            mild systemic disease. After reviewing the risks                            and benefits, the patient was deemed in                            satisfactory condition to undergo the procedure.                           After obtaining informed consent, the endoscope was                            passed under  direct vision. Throughout the                            procedure, the patient's blood pressure, pulse, and                            oxygen saturations were monitored continuously. The                            Endoscope was introduced through the mouth, and                            advanced to the second part of duodenum. The upper                            GI endoscopy was accomplished without difficulty.                            The patient tolerated the procedure well. Scope In: Scope Out: Findings:                 A non-obstructing Schatzki ring was found at the                            gastroesophageal junction. A TTS dilator was passed                            through the scope. Dilation with a 16-17-18 mm                            balloon dilator was performed to 18 mm. The                            dilation site was examined and showed mild mucosal                            disruption.  An approximate 5 cm hiatal hernia was found. This                            has recurred after Nissen fundoplication. The                            herniation is not circumferential, but involves                            approximately 50% circumference when seen in                            retroflexion. The proximal extent of the gastric                            folds (end of tubular esophagus) was 35 cm from the                            incisors. The hiatal narrowing was 40 cm from the                            incisors.                           Normal mucosa was found in the entire examined                            stomach.                           The examined duodenum was normal. Complications:            No immediate complications. Estimated Blood Loss:     Estimated blood loss: none. Impression:               - Non-obstructing Schatzki ring. Dilated to 18 mm                            with balloon.                           -  Recurrence of hiatal hernia after Nissen                            fundoplication.                           - Normal mucosa was found in the entire stomach.                           - Normal examined duodenum.                           - No specimens collected. Recommendation:           - Patient has a contact number available for  emergencies. The signs and symptoms of potential                            delayed complications were discussed with the                            patient. Return to normal activities tomorrow.                            Written discharge instructions were provided to the                            patient.                           - Resume previous diet.                           - Continue present medications.                           - Resume pantoprazole 40 mg once daily given                            recurrent upper GI symptoms at least until                            evaluated by Dr. Kieth Brightly.                           - Patient has follow-up in place with Dr. Kieth Brightly. Jerene Bears, MD 01/19/2020 10:40:29 AM This report has been signed electronically. CC Letter to:             Mickeal Skinner, MD

## 2020-01-19 NOTE — Progress Notes (Signed)
Report to PACU, RN, vss, BBS= Clear.  

## 2020-01-23 ENCOUNTER — Telehealth: Payer: Self-pay

## 2020-01-23 ENCOUNTER — Telehealth: Payer: Self-pay | Admitting: *Deleted

## 2020-01-23 NOTE — Telephone Encounter (Signed)
LVM

## 2020-01-23 NOTE — Telephone Encounter (Signed)
  Follow up Call-  Call back number 01/19/2020  Post procedure Call Back phone  # [phone]  Permission to leave phone message Yes  Some recent data might be hidden     Patient questions:  Do you have a fever, pain , or abdominal swelling? No. Pain Score  0 *  Have you tolerated food without any problems? Yes.    Have you been able to return to your normal activities? Yes.    Do you have any questions about your discharge instructions: Diet   No. Medications  No. Follow up visit  No.  Do you have questions or concerns about your Care? Yes.    Actions: * If pain score is 4 or above: No action needed, pain <4.  1. Have you developed a fever since your procedure? no  2.   Have you had an respiratory symptoms (SOB or cough) since your procedure? no  3.   Have you tested positive for COVID 19 since your procedure no  4.   Have you had any family members/close contacts diagnosed with the COVID 19 since your procedure?  no   If yes to any of these questions please route to Joylene John, RN and Joella Prince, RN

## 2020-02-06 ENCOUNTER — Telehealth: Payer: Self-pay | Admitting: Internal Medicine

## 2020-02-06 MED ORDER — SUTAB 1479-225-188 MG PO TABS: 1.0000 | ORAL_TABLET | ORAL | 0 refills | Status: DC

## 2020-02-06 NOTE — Telephone Encounter (Signed)
Patient indicates that she has tried to pick up her sutab at the [facility] 3 times now and each time they tell her that the sutab is "on order" and will be in the next day but is never there when she goes back. She requests that sutab be sent to [facility] since they do have sutab on hand. Rx sent.

## 2020-02-08 ENCOUNTER — Other Ambulatory Visit: Payer: Self-pay

## 2020-02-08 ENCOUNTER — Ambulatory Visit (AMBULATORY_SURGERY_CENTER): Payer: Federal, State, Local not specified - PPO | Admitting: Internal Medicine

## 2020-02-08 ENCOUNTER — Encounter: Payer: Self-pay | Admitting: Internal Medicine

## 2020-02-08 VITALS — BP 105/62 | HR 59 | Temp 97.8°F | Resp 19 | Ht 63.0 in | Wt 193.0 lb

## 2020-02-08 DIAGNOSIS — K635 Polyp of colon: Secondary | ICD-10-CM | POA: Diagnosis not present

## 2020-02-08 DIAGNOSIS — D120 Benign neoplasm of cecum: Secondary | ICD-10-CM

## 2020-02-08 DIAGNOSIS — Z1211 Encounter for screening for malignant neoplasm of colon: Secondary | ICD-10-CM | POA: Diagnosis not present

## 2020-02-08 DIAGNOSIS — D125 Benign neoplasm of sigmoid colon: Secondary | ICD-10-CM

## 2020-02-08 MED ORDER — SODIUM CHLORIDE 0.9 % IV SOLN: 500.0000 mL | Freq: Once | INTRAVENOUS | Status: DC

## 2020-02-08 NOTE — Progress Notes (Signed)
VS by CW  No changes to medical or social hx since previsit.   Patient nauseated and vomit x1 while in admission.   Zofran 4mg  IV given per Dr Raquel James.

## 2020-02-08 NOTE — Progress Notes (Signed)
Report to PACU, RN, vss, BBS= Clear.  

## 2020-02-08 NOTE — Patient Instructions (Signed)
Thank you for allowing [place] to care for you today!  Await final results of polyps removed, approximately 7-10 days.  Will make recommendation for next colonoscopy at that time.  Resume previous diet and medications today.  Return to your normal activities tomorrow.  May use Miralax daily as needed for regularity.  Mix one capful (17 grams) in a beverage.    YOU HAD AN ENDOSCOPIC PROCEDURE TODAY AT [facility]:   Refer to the procedure report that was given to you for any specific questions about what was found during the examination.  If the procedure report does not answer your questions, please call your gastroenterologist to clarify.  If you requested that your care partner not be given the details of your procedure findings, then the procedure report has been included in a sealed envelope for you to review at your convenience later.  YOU SHOULD EXPECT: Some feelings of bloating in the abdomen. Passage of more gas than usual.  Walking can help get rid of the air that was put into your GI tract during the procedure and reduce the bloating. If you had a lower endoscopy (such as a colonoscopy or flexible sigmoidoscopy) you may notice spotting of blood in your stool or on the toilet paper. If you underwent a bowel prep for your procedure, you may not have a normal bowel movement for a few days.  Please Note:  You might notice some irritation and congestion in your nose or some drainage.  This is from the oxygen used during your procedure.  There is no need for concern and it should clear up in a day or so.  SYMPTOMS TO REPORT IMMEDIATELY:   Following lower endoscopy (colonoscopy or flexible sigmoidoscopy):  Excessive amounts of blood in the stool  Significant tenderness or worsening of abdominal pains  Swelling of the abdomen that is new, acute  Fever of 100F or higher  For urgent or emergent issues, a gastroenterologist can be reached at any hour by calling [phone]  [phone]. Do not use MyChart messaging for urgent concerns.    DIET:  We do recommend a small meal at first, but then you may proceed to your regular diet.  Drink plenty of fluids but you should avoid alcoholic beverages for 24 hours.  ACTIVITY:  You should plan to take it easy for the rest of today and you should NOT DRIVE or use heavy machinery until tomorrow (because of the sedation medicines used during the test).    FOLLOW UP: Our staff will call the number listed on your records 48-72 hours following your procedure to check on you and address any questions or concerns that you may have regarding the information given to you following your procedure. If we do not reach you, we will leave a message.  We will attempt to reach you two times.  During this call, we will ask if you have developed any symptoms of COVID 19. If you develop any symptoms (ie: fever, flu-like symptoms, shortness of breath, cough etc.) before then, please call [phone].  If you test positive for Covid 19 in the 2 weeks post procedure, please call and report this information to [place].    If any biopsies were taken you will be contacted by phone or by letter within the next 1-3 weeks.  Please call us at [phone] if you have not heard about the biopsies in 3 weeks.    SIGNATURES/CONFIDENTIALITY: You and/or your care partner have signed paperwork which will  be entered into your electronic medical record.  These signatures attest to the fact that that the information above on your After Visit Summary has been reviewed and is understood.  Full responsibility of the confidentiality of this discharge information lies with you and/or your care-partner. 

## 2020-02-08 NOTE — Op Note (Signed)
[name] Procedure Date: 02/08/2020 9:35 AM MRN: [id number] Endoscopist: Jerene Bears , MD Age: 49 Referring MD:  Date of Birth: 12/02/1970 Gender: Female Account #: [id number] Procedure:                Colonoscopy Indications:              Screening for colorectal malignant neoplasm, This                            is the patient's first colonoscopy Medicines:                Monitored Anesthesia Care Procedure:                Pre-Anesthesia Assessment:                           - Prior to the procedure, a History and Physical                            was performed, and patient medications and                            allergies were reviewed. The patient's tolerance of                            previous anesthesia was also reviewed. The risks                            and benefits of the procedure and the sedation                            options and risks were discussed with the patient.                            All questions were answered, and informed consent                            was obtained. Prior Anticoagulants: The patient has                            taken no previous anticoagulant or antiplatelet                            agents. ASA Grade Assessment: II - A patient with                            mild systemic disease. After reviewing the risks                            and benefits, the patient was deemed in                            satisfactory condition to undergo the procedure.  After obtaining informed consent, the colonoscope                            was passed under direct vision. Throughout the                            procedure, the patient's blood pressure, pulse, and                            oxygen saturations were monitored continuously. The                            Colonoscope was introduced through the anus and                            advanced to the cecum,  identified by appendiceal                            orifice and ileocecal valve. The colonoscopy was                            performed without difficulty. The patient tolerated                            the procedure well. The quality of the bowel                            preparation was good. The ileocecal valve,                            appendiceal orifice, and rectum were photographed. Scope In: 9:42:30 AM Scope Out: 10:01:17 AM Scope Withdrawal Time: 0 hours 13 minutes 58 seconds  Total Procedure Duration: 0 hours 18 minutes 47 seconds  Findings:                 The digital rectal exam was normal.                           A 4 mm polyp was found in the cecum. The polyp was                            sessile. The polyp was removed with a cold snare.                            Resection and retrieval were complete.                           A 4 mm polyp was found in the sigmoid colon. The                            polyp was sessile. The polyp was removed with a  cold snare. Resection and retrieval were complete.                           Multiple small and large-mouthed diverticula were                            found in the sigmoid colon and descending colon.                           The retroflexed view of the distal rectum and anal                            verge revealed hypertrophied anal papillae and was                            otherwise normal. Complications:            No immediate complications. Estimated Blood Loss:     Estimated blood loss was minimal. Impression:               - One 4 mm polyp in the cecum, removed with a cold                            snare. Resected and retrieved.                           - One 4 mm polyp in the sigmoid colon, removed with                            a cold snare. Resected and retrieved.                           - Diverticulosis in the sigmoid colon and in the                             descending colon. Recommendation:           - Patient has a contact number available for                            emergencies. The signs and symptoms of potential                            delayed complications were discussed with the                            patient. Return to normal activities tomorrow.                            Written discharge instructions were provided to the                            patient.                           -  Resume previous diet.                           - Continue present medications.                           - Await pathology results.                           - Repeat colonoscopy is recommended. The                            colonoscopy date will be determined after pathology                            results from today's exam become available for                            review. Jerene Bears, MD 02/08/2020 10:04:17 AM This report has been signed electronically.

## 2020-02-08 NOTE — Progress Notes (Signed)
Called to room to assist during endoscopic procedure.  Patient ID and intended procedure confirmed with present staff. Received instructions for my participation in the procedure from the performing physician.  

## 2020-02-10 ENCOUNTER — Telehealth: Payer: Self-pay

## 2020-02-10 ENCOUNTER — Telehealth: Payer: Self-pay | Admitting: *Deleted

## 2020-02-10 NOTE — Telephone Encounter (Signed)
Left message on follow up call. 

## 2020-02-10 NOTE — Telephone Encounter (Signed)
°  Follow up Call-  Call back number 02/08/2020 01/19/2020  Post procedure Call Back phone  # [phone]  Permission to leave phone message Yes Yes  Some recent data might be hidden     Patient questions:  Do you have a fever, pain , or abdominal swelling? No. Pain Score  0 *  Have you tolerated food without any problems? Yes.    Have you been able to return to your normal activities? Yes.    Do you have any questions about your discharge instructions: Diet   No. Medications  No. Follow up visit  No.  Do you have questions or concerns about your Care? No.  Actions: * If pain score is 4 or above: No action needed, pain <4.   1. Have you developed a fever since your procedure? no  2.   Have you had an respiratory symptoms (SOB or cough) since your procedure? no  3.   Have you tested positive for COVID 19 since your procedure no  4.   Have you had any family members/close contacts diagnosed with the COVID 19 since your procedure?  no   If yes to any of these questions please route to Joylene John, RN and Joella Prince, RN

## 2020-02-16 ENCOUNTER — Encounter: Payer: Self-pay | Admitting: Internal Medicine

## 2020-03-07 ENCOUNTER — Ambulatory Visit: Payer: Self-pay | Admitting: General Surgery

## 2020-03-07 DIAGNOSIS — K449 Diaphragmatic hernia without obstruction or gangrene: Secondary | ICD-10-CM | POA: Diagnosis not present

## 2020-03-07 MED ORDER — DEXTROSE 5 % IV SOLN: 900.0000 mg | INTRAVENOUS | Status: AC

## 2020-03-07 MED ORDER — GENTAMICIN SULFATE 40 MG/ML IJ SOLN: 5.0000 mg/kg | INTRAVENOUS | Status: AC

## 2020-04-11 NOTE — Patient Instructions (Signed)
DUE TO COVID-19 ONLY ONE VISITOR IS ALLOWED TO COME WITH YOU AND STAY IN THE WAITING ROOM ONLY DURING PRE OP AND PROCEDURE DAY OF SURGERY. THE 1 VISITOR  MAY VISIT WITH YOU AFTER SURGERY IN YOUR PRIVATE ROOM DURING VISITING HOURS ONLY!  YOU NEED TO HAVE A COVID 19 TEST ON: 04/19/20 @             , THIS TEST MUST BE DONE BEFORE SURGERY,  COVID TESTING SITE [name] [address], IT IS ON THE RIGHT GOING OUT [address] APPROXIMATELY  2 MINUTES PAST [employer] ON THE RIGHT. ONCE YOUR COVID TEST IS COMPLETED,  PLEASE BEGIN THE QUARANTINE INSTRUCTIONS AS OUTLINED IN YOUR HANDOUT.                Emily Johns  04/11/2020   Your procedure is scheduled on:    Report to [facility] Main  Entrance   Report to admitting at: 9:00 AM     Call this number if you have problems the morning of surgery [phone]    Remember:   NO SOLID FOOD AFTER MIDNIGHT THE NIGHT PRIOR TO SURGERY. NOTHING BY MOUTH EXCEPT CLEAR LIQUIDS UNTIL: 9:00 AM. PLEASE FINISH ENSURE DRINK PER SURGEON ORDER  WHICH NEEDS TO BE COMPLETED AT: 9:00 AM .  CLEAR LIQUID DIET   Foods Allowed                                                                     Foods Excluded  Coffee and tea, regular and decaf                             liquids that you cannot  Plain Jell-O any favor except red or purple                                           see through such as: Fruit ices (not with fruit pulp)                                     milk, soups, orange juice  Iced Popsicles                                    All solid food Carbonated beverages, regular and diet                                    Cranberry, grape and apple juices Sports drinks like Gatorade Lightly seasoned clear broth or consume(fat free) Sugar, honey syrup  Sample Menu Breakfast                                Lunch  Supper Cranberry juice                    Beef broth                             Chicken broth Jell-O                                     Grape juice                           Apple juice Coffee or tea                        Jell-O                                      Popsicle                                                Coffee or tea                        Coffee or tea  _____________________________________________________________________   BRUSH YOUR TEETH MORNING OF SURGERY AND RINSE YOUR MOUTH OUT, NO CHEWING GUM CANDY OR MINTS.    Take these medicines the morning of surgery with A SIP OF WATER: pantoprazole.                               You may not have any metal on your body including hair pins and              piercings  Do not wear jewelry, lotions, powders or perfumes, deodorant             Men may shave face and neck.   Do not bring valuables to the hospital. [place].  Contacts, dentures or bridgework may not be worn into surgery.  Leave suitcase in the car. After surgery it may be brought to your room.     Patients discharged the day of surgery will not be allowed to drive home. IF YOU ARE HAVING SURGERY AND GOING HOME THE SAME DAY, YOU MUST HAVE AN ADULT TO DRIVE YOU HOME AND BE WITH YOU FOR 24 HOURS. YOU MAY GO HOME BY TAXI OR UBER OR ORTHERWISE, BUT AN ADULT MUST ACCOMPANY YOU HOME AND STAY WITH YOU FOR 24 HOURS.  Name and phone number of your driver:  Special Instructions: N/A              Please read over the following fact sheets you were given: _____________________________________________________________________          [facility] - Preparing for Surgery Before surgery, you can play an important role.  Because skin is not sterile, your skin needs to be as free of germs as possible.  You can reduce the number of germs on your skin by washing with CHG (chlorahexidine  gluconate) soap before surgery.  CHG is an antiseptic cleaner which kills germs and bonds with the skin to continue  killing germs even after washing. Please DO NOT use if you have an allergy to CHG or antibacterial soaps.  If your skin becomes reddened/irritated stop using the CHG and inform your nurse when you arrive at Short Stay. Do not shave (including legs and underarms) for at least 48 hours prior to the first CHG shower.  You may shave your face/neck. Please follow these instructions carefully:  1.  Shower with CHG Soap the night before surgery and the  morning of Surgery.  2.  If you choose to wash your hair, wash your hair first as usual with your  normal  shampoo.  3.  After you shampoo, rinse your hair and body thoroughly to remove the  shampoo.                           4.  Use CHG as you would any other liquid soap.  You can apply chg directly  to the skin and wash                       Gently with a scrungie or clean washcloth.  5.  Apply the CHG Soap to your body ONLY FROM THE NECK DOWN.   Do not use on face/ open                           Wound or open sores. Avoid contact with eyes, ears mouth and genitals (private parts).                       Wash face,  Genitals (private parts) with your normal soap.             6.  Wash thoroughly, paying special attention to the area where your surgery  will be performed.  7.  Thoroughly rinse your body with warm water from the neck down.  8.  DO NOT shower/wash with your normal soap after using and rinsing off  the CHG Soap.                9.  Pat yourself dry with a clean towel.            10.  Wear clean pajamas.            11.  Place clean sheets on your bed the night of your first shower and do not  sleep with pets. Day of Surgery : Do not apply any lotions/deodorants the morning of surgery.  Please wear clean clothes to the hospital/surgery center.  FAILURE TO FOLLOW THESE INSTRUCTIONS MAY RESULT IN THE CANCELLATION OF YOUR SURGERY PATIENT SIGNATURE_________________________________  NURSE  SIGNATURE__________________________________  ________________________________________________________________________

## 2020-04-12 ENCOUNTER — Encounter (HOSPITAL_COMMUNITY)
Admission: RE | Admit: 2020-04-12 | Discharge: 2020-04-12 | Disposition: A | Discharge status: Home / Self Care | Payer: Federal, State, Local not specified - PPO | Source: Ambulatory Visit | Attending: Anesthesiology | Admitting: Anesthesiology

## 2020-04-14 ENCOUNTER — Other Ambulatory Visit (HOSPITAL_COMMUNITY): Payer: Federal, State, Local not specified - PPO

## 2020-04-19 ENCOUNTER — Ambulatory Visit: Payer: Federal, State, Local not specified - PPO | Admitting: Urology

## 2020-04-19 ENCOUNTER — Encounter (HOSPITAL_COMMUNITY): Admission: RE | Admit: 2020-04-19 | Payer: Federal, State, Local not specified - PPO | Source: Ambulatory Visit

## 2020-04-20 ENCOUNTER — Other Ambulatory Visit (HOSPITAL_COMMUNITY): Payer: Federal, State, Local not specified - PPO

## 2020-05-14 DIAGNOSIS — Z1331 Encounter for screening for depression: Secondary | ICD-10-CM | POA: Diagnosis not present

## 2020-05-14 DIAGNOSIS — Z6830 Body mass index (BMI) 30.0-30.9, adult: Secondary | ICD-10-CM | POA: Diagnosis not present

## 2020-05-14 DIAGNOSIS — J019 Acute sinusitis, unspecified: Secondary | ICD-10-CM | POA: Diagnosis not present

## 2020-05-14 DIAGNOSIS — R1907 Generalized intra-abdominal and pelvic swelling, mass and lump: Secondary | ICD-10-CM | POA: Diagnosis not present

## 2020-05-14 DIAGNOSIS — M7062 Trochanteric bursitis, left hip: Secondary | ICD-10-CM | POA: Diagnosis not present

## 2020-05-15 ENCOUNTER — Other Ambulatory Visit: Payer: Self-pay | Admitting: Physician Assistant

## 2020-05-15 ENCOUNTER — Other Ambulatory Visit (HOSPITAL_COMMUNITY): Payer: Self-pay | Admitting: Physician Assistant

## 2020-05-15 DIAGNOSIS — R1907 Generalized intra-abdominal and pelvic swelling, mass and lump: Secondary | ICD-10-CM

## 2020-05-21 ENCOUNTER — Other Ambulatory Visit: Payer: Self-pay

## 2020-05-21 ENCOUNTER — Ambulatory Visit (HOSPITAL_COMMUNITY)
Admission: RE | Admit: 2020-05-21 | Discharge: 2020-05-21 | Disposition: A | Discharge status: Home / Self Care | Payer: Federal, State, Local not specified - PPO | Source: Ambulatory Visit | Attending: Physician Assistant | Admitting: Physician Assistant

## 2020-05-21 DIAGNOSIS — R1907 Generalized intra-abdominal and pelvic swelling, mass and lump: Secondary | ICD-10-CM | POA: Insufficient documentation

## 2020-05-21 DIAGNOSIS — K7689 Other specified diseases of liver: Secondary | ICD-10-CM | POA: Diagnosis not present

## 2020-06-07 DIAGNOSIS — Z1231 Encounter for screening mammogram for malignant neoplasm of breast: Secondary | ICD-10-CM | POA: Diagnosis not present

## 2020-06-08 NOTE — Progress Notes (Signed)
DUE TO COVID-19 ONLY ONE VISITOR IS ALLOWED TO COME WITH YOU AND STAY IN THE WAITING ROOM ONLY DURING PRE OP AND PROCEDURE DAY OF SURGERY. THE 1 VISITOR  MAY VISIT WITH YOU AFTER SURGERY IN YOUR PRIVATE ROOM DURING VISITING HOURS ONLY!  YOU NEED TO HAVE A COVID 19 TEST ON_3/21/2022 ______ @_______ , THIS TEST MUST BE DONE BEFORE SURGERY,  COVID TESTING SITE [address] [place] [address], IT IS ON THE RIGHT GOING OUT [address] APPROXIMATELY  2 MINUTES PAST [employer] ON THE RIGHT. ONCE YOUR COVID TEST IS COMPLETED,  PLEASE BEGIN THE QUARANTINE INSTRUCTIONS AS OUTLINED IN YOUR HANDOUT.                Emily Johns  06/08/2020   Your procedure is scheduled on: 06/21/2020    Report to [facility] Main  Entrance   Report to admitting at   0530 AM     Call this number if you have problems the morning of surgery [phone]    REMEMBER: NO  SOLID FOOD CANDY OR GUM AFTER MIDNIGHT. CLEAR LIQUIDS UNTIL  0430am        . NOTHING BY MOUTH EXCEPT CLEAR LIQUIDS UNTIL  0430am   . PLEASE FINISH ENSURE DRINK PER SURGEON ORDER  WHICH NEEDS TO BE COMPLETED AT      . 0430am      CLEAR LIQUID DIET   Foods Allowed                                                                    Coffee and tea, regular and decaf                            Fruit ices (not with fruit pulp)                                      Iced Popsicles                                    Carbonated beverages, regular and diet                                    Cranberry, grape and apple juices Sports drinks like Gatorade Lightly seasoned clear broth or consume(fat free) Sugar, honey syrup ___________________________________________________________________      BRUSH YOUR TEETH MORNING OF SURGERY AND RINSE YOUR MOUTH OUT, NO CHEWING GUM CANDY OR MINTS.     Take these medicines the morning of surgery with A SIP OF WATER: Protonix  DO NOT TAKE ANY DIABETIC MEDICATIONS DAY OF YOUR SURGERY                                You may not have any metal on your body including hair pins and              piercings  Do not wear  jewelry, make-up, lotions, powders or perfumes, deodorant             Do not wear nail polish on your fingernails.  Do not shave  48 hours prior to surgery.              Men may shave face and neck.   Do not bring valuables to the hospital. [place].  Contacts, dentures or bridgework may not be worn into surgery.  Leave suitcase in the car. After surgery it may be brought to your room.     Patients discharged the day of surgery will not be allowed to drive home. IF YOU ARE HAVING SURGERY AND GOING HOME THE SAME DAY, YOU MUST HAVE AN ADULT TO DRIVE YOU HOME AND BE WITH YOU FOR 24 HOURS. YOU MAY GO HOME BY TAXI OR UBER OR ORTHERWISE, BUT AN ADULT MUST ACCOMPANY YOU HOME AND STAY WITH YOU FOR 24 HOURS.  Name and phone number of your driver:  Special Instructions: N/A              Please read over the following fact sheets you were given: _____________________________________________________________________  [facility] - Preparing for Surgery Before surgery, you can play an important role.  Because skin is not sterile, your skin needs to be as free of germs as possible.  You can reduce the number of germs on your skin by washing with CHG (chlorahexidine gluconate) soap before surgery.  CHG is an antiseptic cleaner which kills germs and bonds with the skin to continue killing germs even after washing. Please DO NOT use if you have an allergy to CHG or antibacterial soaps.  If your skin becomes reddened/irritated stop using the CHG and inform your nurse when you arrive at Short Stay. Do not shave (including legs and underarms) for at least 48 hours prior to the first CHG shower.  You may shave your face/neck. Please follow these instructions carefully:  1.  Shower with CHG Soap the night before surgery and the  morning of  Surgery.  2.  If you choose to wash your hair, wash your hair first as usual with your  normal  shampoo.  3.  After you shampoo, rinse your hair and body thoroughly to remove the  shampoo.                           4.  Use CHG as you would any other liquid soap.  You can apply chg directly  to the skin and wash                       Gently with a scrungie or clean washcloth.  5.  Apply the CHG Soap to your body ONLY FROM THE NECK DOWN.   Do not use on face/ open                           Wound or open sores. Avoid contact with eyes, ears mouth and genitals (private parts).                       Wash face,  Genitals (private parts) with your normal soap.             6.  Wash thoroughly, paying special  attention to the area where your surgery  will be performed.  7.  Thoroughly rinse your body with warm water from the neck down.  8.  DO NOT shower/wash with your normal soap after using and rinsing off  the CHG Soap.                9.  Pat yourself dry with a clean towel.            10.  Wear clean pajamas.            11.  Place clean sheets on your bed the night of your first shower and do not  sleep with pets. Day of Surgery : Do not apply any lotions/deodorants the morning of surgery.  Please wear clean clothes to the hospital/surgery center.  FAILURE TO FOLLOW THESE INSTRUCTIONS MAY RESULT IN THE CANCELLATION OF YOUR SURGERY PATIENT SIGNATURE_________________________________  NURSE SIGNATURE__________________________________  ________________________________________________________________________

## 2020-06-12 ENCOUNTER — Other Ambulatory Visit: Payer: Self-pay

## 2020-06-12 ENCOUNTER — Encounter (HOSPITAL_COMMUNITY)
Admission: RE | Admit: 2020-06-12 | Discharge: 2020-06-12 | Disposition: A | Discharge status: Home / Self Care | Payer: Federal, State, Local not specified - PPO | Source: Ambulatory Visit | Attending: General Surgery | Admitting: General Surgery

## 2020-06-12 ENCOUNTER — Encounter (HOSPITAL_COMMUNITY): Payer: Self-pay

## 2020-06-12 DIAGNOSIS — Z01812 Encounter for preprocedural laboratory examination: Secondary | ICD-10-CM | POA: Diagnosis not present

## 2020-06-12 HISTORY — DX: Gastro-esophageal reflux disease without esophagitis: K21.9

## 2020-06-12 LAB — CBC
HCT: 35.9 % — ABNORMAL LOW (ref 36.0–46.0)
Hemoglobin: 11.2 g/dL — ABNORMAL LOW (ref 12.0–15.0)
MCH: 28.6 pg (ref 26.0–34.0)
MCHC: 31.2 g/dL (ref 30.0–36.0)
MCV: 91.6 fL (ref 80.0–100.0)
Platelets: 246 10*3/uL (ref 150–400)
RBC: 3.92 MIL/uL (ref 3.87–5.11)
RDW: 13 % (ref 11.5–15.5)
WBC: 3.2 10*3/uL — ABNORMAL LOW (ref 4.0–10.5)
nRBC: 0 % (ref 0.0–0.2)

## 2020-06-12 NOTE — Progress Notes (Signed)
Anesthesia Review:  PCP: [facility] in [place], [place]  Cardiologist : Chest x-ray : EKG : Echo : 2018  Stress test: Cardiac Cath :  Activity level: can do a flight of stiars without difficulty  Sleep Study/ CPAP :no  Fasting Blood Sugar :      / Checks Blood Sugar -- times a day:   Blood Thinner/ Instructions /Last Dose: ASA / Instructions/ Last Dose :

## 2020-06-18 ENCOUNTER — Other Ambulatory Visit (HOSPITAL_COMMUNITY)
Admission: RE | Admit: 2020-06-18 | Discharge: 2020-06-18 | Disposition: A | Discharge status: Home / Self Care | Payer: Federal, State, Local not specified - PPO | Source: Ambulatory Visit | Attending: General Surgery | Admitting: General Surgery

## 2020-06-18 DIAGNOSIS — Z01812 Encounter for preprocedural laboratory examination: Secondary | ICD-10-CM | POA: Insufficient documentation

## 2020-06-18 DIAGNOSIS — Z881 Allergy status to other antibiotic agents status: Secondary | ICD-10-CM | POA: Diagnosis not present

## 2020-06-18 DIAGNOSIS — K219 Gastro-esophageal reflux disease without esophagitis: Secondary | ICD-10-CM | POA: Diagnosis not present

## 2020-06-18 DIAGNOSIS — Z20822 Contact with and (suspected) exposure to covid-19: Secondary | ICD-10-CM | POA: Insufficient documentation

## 2020-06-18 DIAGNOSIS — K449 Diaphragmatic hernia without obstruction or gangrene: Secondary | ICD-10-CM | POA: Diagnosis not present

## 2020-06-18 DIAGNOSIS — Z91041 Radiographic dye allergy status: Secondary | ICD-10-CM | POA: Diagnosis not present

## 2020-06-18 DIAGNOSIS — J45909 Unspecified asthma, uncomplicated: Secondary | ICD-10-CM | POA: Diagnosis not present

## 2020-06-18 DIAGNOSIS — Z880 Allergy status to penicillin: Secondary | ICD-10-CM | POA: Diagnosis not present

## 2020-06-18 DIAGNOSIS — Z79899 Other long term (current) drug therapy: Secondary | ICD-10-CM | POA: Diagnosis not present

## 2020-06-18 DIAGNOSIS — Z91013 Allergy to seafood: Secondary | ICD-10-CM | POA: Diagnosis not present

## 2020-06-18 DIAGNOSIS — E785 Hyperlipidemia, unspecified: Secondary | ICD-10-CM | POA: Diagnosis not present

## 2020-06-18 DIAGNOSIS — Z886 Allergy status to analgesic agent status: Secondary | ICD-10-CM | POA: Diagnosis not present

## 2020-06-19 LAB — SARS CORONAVIRUS 2 (TAT 6-24 HRS): SARS Coronavirus 2: NEGATIVE

## 2020-06-20 NOTE — Anesthesia Preprocedure Evaluation (Addendum)
Anesthesia Evaluation  Patient identified by MRN, date of birth, ID band Patient awake    Reviewed: Allergy & Precautions, NPO status , Patient's Chart, lab work & pertinent test results  History of Anesthesia Complications (+) PONV and history of anesthetic complications  Airway Mallampati: II  TM Distance: >3 FB Neck ROM: Full    Dental no notable dental hx. (+) Dental Advisory Given   Pulmonary asthma ,    Pulmonary exam normal        Cardiovascular negative cardio ROS Normal cardiovascular exam  Impressions:   - Normal LV wall thickness with LVEF 60-65% and normal diastolic  function. Trivial mitral and tricuspid regurgitation. 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    Neuro/Psych negative neurological ROS     GI/Hepatic Neg liver ROS, hiatal hernia,   Endo/Other  negative endocrine ROS  Renal/GU negative Renal ROS     Musculoskeletal   Abdominal   Peds  Hematology   Anesthesia Other Findings   Reproductive/Obstetrics                            Anesthesia Physical  Anesthesia Plan  ASA: II  Anesthesia Plan: General   Post-op Pain Management:    Induction: Intravenous  PONV Risk Score and Plan: 4 or greater and Ondansetron, Aprepitant, Dexamethasone and TIVA  Airway Management Planned: Oral ETT  Additional Equipment:   Intra-op Plan:   Post-operative Plan: Extubation in OR  Informed Consent: I have reviewed the patients History and Physical, chart, labs and discussed the procedure including the risks, benefits and alternatives for the proposed anesthesia with the patient or authorized representative who has indicated his/her understanding and acceptance.     Dental advisory given  Plan Discussed with: Anesthesiologist  Anesthesia Plan Comments:       Anesthesia Quick Evaluation

## 2020-06-21 ENCOUNTER — Other Ambulatory Visit: Payer: Self-pay

## 2020-06-21 ENCOUNTER — Encounter (HOSPITAL_COMMUNITY)
Admission: RE | Disposition: A | Discharge status: Home / Self Care | Payer: Self-pay | Source: Home / Self Care | Attending: General Surgery

## 2020-06-21 ENCOUNTER — Inpatient Hospital Stay (HOSPITAL_COMMUNITY): Payer: Federal, State, Local not specified - PPO | Admitting: Anesthesiology

## 2020-06-21 ENCOUNTER — Encounter (HOSPITAL_COMMUNITY): Payer: Self-pay | Admitting: General Surgery

## 2020-06-21 ENCOUNTER — Inpatient Hospital Stay (HOSPITAL_COMMUNITY)
Admission: RE | Admit: 2020-06-21 | Discharge: 2020-06-23 | DRG: 328 | Disposition: A | Discharge status: Home / Self Care | Payer: Federal, State, Local not specified - PPO | Attending: General Surgery | Admitting: General Surgery

## 2020-06-21 DIAGNOSIS — Z881 Allergy status to other antibiotic agents status: Secondary | ICD-10-CM

## 2020-06-21 DIAGNOSIS — Z91013 Allergy to seafood: Secondary | ICD-10-CM | POA: Diagnosis not present

## 2020-06-21 DIAGNOSIS — Z79899 Other long term (current) drug therapy: Secondary | ICD-10-CM

## 2020-06-21 DIAGNOSIS — E785 Hyperlipidemia, unspecified: Secondary | ICD-10-CM | POA: Diagnosis present

## 2020-06-21 DIAGNOSIS — Z91041 Radiographic dye allergy status: Secondary | ICD-10-CM

## 2020-06-21 DIAGNOSIS — J209 Acute bronchitis, unspecified: Secondary | ICD-10-CM | POA: Diagnosis not present

## 2020-06-21 DIAGNOSIS — K219 Gastro-esophageal reflux disease without esophagitis: Secondary | ICD-10-CM | POA: Diagnosis present

## 2020-06-21 DIAGNOSIS — E559 Vitamin D deficiency, unspecified: Secondary | ICD-10-CM | POA: Diagnosis not present

## 2020-06-21 DIAGNOSIS — Z880 Allergy status to penicillin: Secondary | ICD-10-CM | POA: Diagnosis not present

## 2020-06-21 DIAGNOSIS — Z20822 Contact with and (suspected) exposure to covid-19: Secondary | ICD-10-CM | POA: Diagnosis present

## 2020-06-21 DIAGNOSIS — K449 Diaphragmatic hernia without obstruction or gangrene: Secondary | ICD-10-CM | POA: Diagnosis not present

## 2020-06-21 DIAGNOSIS — J45909 Unspecified asthma, uncomplicated: Secondary | ICD-10-CM | POA: Diagnosis not present

## 2020-06-21 DIAGNOSIS — Z886 Allergy status to analgesic agent status: Secondary | ICD-10-CM

## 2020-06-21 HISTORY — PX: HIATAL HERNIA REPAIR: SHX195

## 2020-06-21 HISTORY — PX: GASTROSTOMY: SHX5249

## 2020-06-21 LAB — CBC
HCT: 33.3 % — ABNORMAL LOW (ref 36.0–46.0)
Hemoglobin: 10.6 g/dL — ABNORMAL LOW (ref 12.0–15.0)
MCH: 28.9 pg (ref 26.0–34.0)
MCHC: 31.8 g/dL (ref 30.0–36.0)
MCV: 90.7 fL (ref 80.0–100.0)
Platelets: 247 10*3/uL (ref 150–400)
RBC: 3.67 MIL/uL — ABNORMAL LOW (ref 3.87–5.11)
RDW: 12.8 % (ref 11.5–15.5)
WBC: 8.4 10*3/uL (ref 4.0–10.5)
nRBC: 0 % (ref 0.0–0.2)

## 2020-06-21 LAB — CREATININE, SERUM
Creatinine, Ser: 0.53 mg/dL (ref 0.44–1.00)
GFR, Estimated: >60 mL/min (ref >60)

## 2020-06-21 SURGERY — REPAIR, HERNIA, HIATAL, LAPAROSCOPIC
Anesthesia: General | Site: Abdomen

## 2020-06-21 MED ORDER — BUPIVACAINE-EPINEPHRINE (PF) 0.25% -1:200000 IJ SOLN
INTRAMUSCULAR | Status: DC | PRN
  Administered 2020-06-21: 30 mL

## 2020-06-21 MED ORDER — OXYCODONE HCL 5 MG PO TABS
5.0000 mg | ORAL_TABLET | ORAL | Status: DC | PRN
  Administered 2020-06-21: 10 mg via ORAL
  Filled 2020-06-21 (×2): qty 2

## 2020-06-21 MED ORDER — LACTATED RINGERS IR SOLN
Status: DC | PRN
  Administered 2020-06-21: 1000 mL

## 2020-06-21 MED ORDER — ALBUTEROL SULFATE HFA 108 (90 BASE) MCG/ACT IN AERS
1.0000 | INHALATION_SPRAY | Freq: Four times a day (QID) | RESPIRATORY_TRACT | Status: DC | PRN
  Filled 2020-06-21: qty 6.7

## 2020-06-21 MED ORDER — CHLORHEXIDINE GLUCONATE 0.12 % MT SOLN: 15.0000 mL | Freq: Once | OROMUCOSAL | Status: AC

## 2020-06-21 MED ORDER — PHENYLEPHRINE 40 MCG/ML (10ML) SYRINGE FOR IV PUSH (FOR BLOOD PRESSURE SUPPORT)
PREFILLED_SYRINGE | INTRAVENOUS | Status: DC | PRN
  Administered 2020-06-21 (×4): 40 ug via INTRAVENOUS

## 2020-06-21 MED ORDER — ROCURONIUM BROMIDE 10 MG/ML (PF) SYRINGE
PREFILLED_SYRINGE | INTRAVENOUS | Status: AC
  Filled 2020-06-21: qty 10

## 2020-06-21 MED ORDER — LIDOCAINE 2% (20 MG/ML) 5 ML SYRINGE
INTRAMUSCULAR | Status: AC
  Filled 2020-06-21: qty 5

## 2020-06-21 MED ORDER — KETAMINE HCL 10 MG/ML IJ SOLN
INTRAMUSCULAR | Status: AC
  Filled 2020-06-21: qty 1

## 2020-06-21 MED ORDER — FENTANYL CITRATE (PF) 100 MCG/2ML IJ SOLN
25.0000 ug | INTRAMUSCULAR | Status: DC | PRN
Start: 2020-06-21 — End: 2020-06-21
  Administered 2020-06-21 (×4): 25 ug via INTRAVENOUS

## 2020-06-21 MED ORDER — CEFAZOLIN SODIUM-DEXTROSE 2-4 GM/100ML-% IV SOLN
INTRAVENOUS | Status: AC
  Filled 2020-06-21: qty 100

## 2020-06-21 MED ORDER — KETAMINE HCL 10 MG/ML IJ SOLN
INTRAMUSCULAR | Status: DC | PRN
  Administered 2020-06-21 (×2): 10 mg via INTRAVENOUS

## 2020-06-21 MED ORDER — LACTATED RINGERS IV SOLN: INTRAVENOUS | Status: DC | PRN

## 2020-06-21 MED ORDER — MIDAZOLAM HCL 5 MG/5ML IJ SOLN
INTRAMUSCULAR | Status: DC | PRN
  Administered 2020-06-21: 2 mg via INTRAVENOUS

## 2020-06-21 MED ORDER — FENTANYL CITRATE (PF) 100 MCG/2ML IJ SOLN
INTRAMUSCULAR | Status: DC | PRN
  Administered 2020-06-21: 50 ug via INTRAVENOUS
  Administered 2020-06-21: 25 ug via INTRAVENOUS
  Administered 2020-06-21: 50 ug via INTRAVENOUS
  Administered 2020-06-21: 25 ug via INTRAVENOUS
  Administered 2020-06-21: 50 ug via INTRAVENOUS

## 2020-06-21 MED ORDER — DEXAMETHASONE SODIUM PHOSPHATE 10 MG/ML IJ SOLN
INTRAMUSCULAR | Status: DC | PRN
  Administered 2020-06-21: 10 mg via INTRAVENOUS

## 2020-06-21 MED ORDER — ALBUMIN HUMAN 5 % IV SOLN
INTRAVENOUS | Status: AC
  Filled 2020-06-21: qty 250

## 2020-06-21 MED ORDER — PROPOFOL 10 MG/ML IV BOLUS
INTRAVENOUS | Status: AC
  Filled 2020-06-21: qty 40

## 2020-06-21 MED ORDER — MIDAZOLAM HCL 2 MG/2ML IJ SOLN
INTRAMUSCULAR | Status: AC
  Filled 2020-06-21: qty 2

## 2020-06-21 MED ORDER — PROPOFOL 500 MG/50ML IV EMUL
INTRAVENOUS | Status: DC | PRN
  Administered 2020-06-21: 125 ug/kg/min via INTRAVENOUS

## 2020-06-21 MED ORDER — ESMOLOL HCL 100 MG/10ML IV SOLN
INTRAVENOUS | Status: AC
  Filled 2020-06-21: qty 10

## 2020-06-21 MED ORDER — LIDOCAINE HCL (PF) 2 % IJ SOLN
INTRAMUSCULAR | Status: DC | PRN
  Administered 2020-06-21: 1.5 mg/kg/h via INTRADERMAL

## 2020-06-21 MED ORDER — MORPHINE SULFATE (PF) 2 MG/ML IV SOLN: 2.0000 mg | INTRAVENOUS | Status: DC | PRN

## 2020-06-21 MED ORDER — SIMETHICONE 80 MG PO CHEW: 40.0000 mg | CHEWABLE_TABLET | Freq: Four times a day (QID) | ORAL | Status: DC | PRN

## 2020-06-21 MED ORDER — DIPHENHYDRAMINE HCL 50 MG/ML IJ SOLN
25.0000 mg | Freq: Four times a day (QID) | INTRAMUSCULAR | Status: DC | PRN
Start: 2020-06-21 — End: 2020-06-23

## 2020-06-21 MED ORDER — DEXMEDETOMIDINE (PRECEDEX) IN NS 20 MCG/5ML (4 MCG/ML) IV SYRINGE
PREFILLED_SYRINGE | INTRAVENOUS | Status: AC
  Filled 2020-06-21: qty 5

## 2020-06-21 MED ORDER — PROPOFOL 10 MG/ML IV BOLUS
INTRAVENOUS | Status: DC | PRN
  Administered 2020-06-21: 150 mg via INTRAVENOUS

## 2020-06-21 MED ORDER — FENTANYL CITRATE (PF) 250 MCG/5ML IJ SOLN
INTRAMUSCULAR | Status: AC
  Filled 2020-06-21: qty 5

## 2020-06-21 MED ORDER — PROPOFOL 1000 MG/100ML IV EMUL
INTRAVENOUS | Status: AC
  Filled 2020-06-21: qty 100

## 2020-06-21 MED ORDER — CELECOXIB 200 MG PO CAPS
200.0000 mg | ORAL_CAPSULE | Freq: Once | ORAL | Status: AC
  Administered 2020-06-21: 200 mg via ORAL
  Filled 2020-06-21: qty 1

## 2020-06-21 MED ORDER — 0.9 % SODIUM CHLORIDE (POUR BTL) OPTIME
TOPICAL | Status: DC | PRN
  Administered 2020-06-21: 1000 mL

## 2020-06-21 MED ORDER — SUGAMMADEX SODIUM 200 MG/2ML IV SOLN
INTRAVENOUS | Status: DC | PRN
  Administered 2020-06-21: 200 mg via INTRAVENOUS

## 2020-06-21 MED ORDER — ALBUMIN HUMAN 5 % IV SOLN: INTRAVENOUS | Status: DC | PRN

## 2020-06-21 MED ORDER — APREPITANT 40 MG PO CAPS
40.0000 mg | ORAL_CAPSULE | Freq: Once | ORAL | Status: AC
  Administered 2020-06-21: 40 mg via ORAL
  Filled 2020-06-21: qty 1

## 2020-06-21 MED ORDER — ACETAMINOPHEN 500 MG PO TABS
1000.0000 mg | ORAL_TABLET | Freq: Once | ORAL | Status: AC
  Administered 2020-06-21: 1000 mg via ORAL
  Filled 2020-06-21: qty 2

## 2020-06-21 MED ORDER — PROPOFOL 10 MG/ML IV BOLUS
INTRAVENOUS | Status: AC
  Filled 2020-06-21: qty 20

## 2020-06-21 MED ORDER — LIDOCAINE 2% (20 MG/ML) 5 ML SYRINGE
INTRAMUSCULAR | Status: AC
  Filled 2020-06-21: qty 15

## 2020-06-21 MED ORDER — ONDANSETRON HCL 4 MG/2ML IJ SOLN
INTRAMUSCULAR | Status: DC | PRN
  Administered 2020-06-21 (×2): 4 mg via INTRAVENOUS

## 2020-06-21 MED ORDER — ONDANSETRON HCL 4 MG/2ML IJ SOLN
4.0000 mg | Freq: Four times a day (QID) | INTRAMUSCULAR | Status: DC | PRN
  Administered 2020-06-21 – 2020-06-22 (×3): 4 mg via INTRAVENOUS
  Filled 2020-06-21 (×3): qty 2

## 2020-06-21 MED ORDER — ESMOLOL HCL-SODIUM CHLORIDE 2000 MG/100ML IV SOLN
INTRAVENOUS | Status: DC | PRN
  Administered 2020-06-21 (×6): 10 mg via INTRAVENOUS

## 2020-06-21 MED ORDER — EPHEDRINE SULFATE-NACL 50-0.9 MG/10ML-% IV SOSY
PREFILLED_SYRINGE | INTRAVENOUS | Status: DC | PRN
  Administered 2020-06-21 (×2): 10 mg via INTRAVENOUS

## 2020-06-21 MED ORDER — DEXAMETHASONE SODIUM PHOSPHATE 10 MG/ML IJ SOLN
INTRAMUSCULAR | Status: AC
  Filled 2020-06-21: qty 1

## 2020-06-21 MED ORDER — ONDANSETRON 4 MG PO TBDP: 4.0000 mg | ORAL_TABLET | Freq: Four times a day (QID) | ORAL | Status: DC | PRN

## 2020-06-21 MED ORDER — CIPROFLOXACIN IN D5W 400 MG/200ML IV SOLN
INTRAVENOUS | Status: AC
  Filled 2020-06-21: qty 200

## 2020-06-21 MED ORDER — BUPIVACAINE-EPINEPHRINE (PF) 0.25% -1:200000 IJ SOLN
INTRAMUSCULAR | Status: AC
  Filled 2020-06-21: qty 30

## 2020-06-21 MED ORDER — ROCURONIUM BROMIDE 100 MG/10ML IV SOLN
INTRAVENOUS | Status: DC | PRN
  Administered 2020-06-21: 20 mg via INTRAVENOUS
  Administered 2020-06-21 (×2): 10 mg via INTRAVENOUS
  Administered 2020-06-21: 70 mg via INTRAVENOUS
  Administered 2020-06-21: 10 mg via INTRAVENOUS

## 2020-06-21 MED ORDER — KCL IN DEXTROSE-NACL 20-5-0.45 MEQ/L-%-% IV SOLN
INTRAVENOUS | Status: DC
  Filled 2020-06-21 (×2): qty 1000

## 2020-06-21 MED ORDER — DEXMEDETOMIDINE (PRECEDEX) IN NS 20 MCG/5ML (4 MCG/ML) IV SYRINGE
PREFILLED_SYRINGE | INTRAVENOUS | Status: DC | PRN
  Administered 2020-06-21 (×5): 4 ug via INTRAVENOUS

## 2020-06-21 MED ORDER — ORAL CARE MOUTH RINSE
15.0000 mL | Freq: Once | OROMUCOSAL | Status: AC
  Administered 2020-06-21: 15 mL via OROMUCOSAL

## 2020-06-21 MED ORDER — LACTATED RINGERS IV SOLN: INTRAVENOUS | Status: DC

## 2020-06-21 MED ORDER — PHENYLEPHRINE 40 MCG/ML (10ML) SYRINGE FOR IV PUSH (FOR BLOOD PRESSURE SUPPORT)
PREFILLED_SYRINGE | INTRAVENOUS | Status: AC
  Filled 2020-06-21: qty 10

## 2020-06-21 MED ORDER — ONDANSETRON HCL 4 MG/2ML IJ SOLN
INTRAMUSCULAR | Status: AC
  Filled 2020-06-21: qty 2

## 2020-06-21 MED ORDER — BUPIVACAINE LIPOSOME 1.3 % IJ SUSP
20.0000 mL | Freq: Once | INTRAMUSCULAR | Status: AC
  Administered 2020-06-21: 20 mL
  Administered 2020-06-21: 266 mg
  Filled 2020-06-21: qty 20

## 2020-06-21 MED ORDER — CIPROFLOXACIN IN D5W 400 MG/200ML IV SOLN
400.0000 mg | INTRAVENOUS | Status: AC
  Administered 2020-06-21: 400 mg via INTRAVENOUS

## 2020-06-21 MED ORDER — FENTANYL CITRATE (PF) 100 MCG/2ML IJ SOLN
INTRAMUSCULAR | Status: AC
  Filled 2020-06-21: qty 2

## 2020-06-21 MED ORDER — ACETAMINOPHEN 500 MG PO TABS
1000.0000 mg | ORAL_TABLET | Freq: Four times a day (QID) | ORAL | Status: DC
  Administered 2020-06-21: 1000 mg via ORAL
  Filled 2020-06-21 (×6): qty 2

## 2020-06-21 MED ORDER — ENOXAPARIN SODIUM 40 MG/0.4ML ~~LOC~~ SOLN
40.0000 mg | SUBCUTANEOUS | Status: DC
  Administered 2020-06-22 – 2020-06-23 (×2): 40 mg via SUBCUTANEOUS
  Filled 2020-06-21 (×2): qty 0.4

## 2020-06-21 MED ORDER — DIPHENHYDRAMINE HCL 25 MG PO CAPS: 25.0000 mg | ORAL_CAPSULE | Freq: Four times a day (QID) | ORAL | Status: DC | PRN

## 2020-06-21 MED ORDER — KETOROLAC TROMETHAMINE 30 MG/ML IJ SOLN
30.0000 mg | Freq: Four times a day (QID) | INTRAMUSCULAR | Status: DC | PRN
  Administered 2020-06-22 (×2): 30 mg via INTRAVENOUS
  Filled 2020-06-21 (×2): qty 1

## 2020-06-21 MED ORDER — LIDOCAINE 2% (20 MG/ML) 5 ML SYRINGE
INTRAMUSCULAR | Status: DC | PRN
  Administered 2020-06-21: 100 mg via INTRAVENOUS

## 2020-06-21 MED ORDER — METOPROLOL TARTRATE 5 MG/5ML IV SOLN
5.0000 mg | Freq: Four times a day (QID) | INTRAVENOUS | Status: DC | PRN
  Filled 2020-06-21: qty 5

## 2020-06-21 MED ORDER — EPHEDRINE 5 MG/ML INJ
INTRAVENOUS | Status: AC
  Filled 2020-06-21: qty 10

## 2020-06-21 SURGICAL SUPPLY — 55 items
APPLIER CLIP 5 13 M/L LIGAMAX5 (MISCELLANEOUS)
APPLIER CLIP ROT 10 11.4 M/L (STAPLE)
BENZOIN TINCTURE PRP APPL 2/3 (GAUZE/BANDAGES/DRESSINGS) ×3 IMPLANT
BNDG ADH 1X3 SHEER STRL LF (GAUZE/BANDAGES/DRESSINGS) ×18 IMPLANT
CHLORAPREP W/TINT 26 (MISCELLANEOUS) ×3 IMPLANT
CLIP APPLIE 5 13 M/L LIGAMAX5 (MISCELLANEOUS) IMPLANT
CLIP APPLIE ROT 10 11.4 M/L (STAPLE) IMPLANT
COVER SURGICAL LIGHT HANDLE (MISCELLANEOUS) ×3 IMPLANT
COVER WAND RF STERILE (DRAPES) IMPLANT
DECANTER SPIKE VIAL GLASS SM (MISCELLANEOUS) IMPLANT
DERMABOND ADVANCED (GAUZE/BANDAGES/DRESSINGS) ×1
DERMABOND ADVANCED .7 DNX12 (GAUZE/BANDAGES/DRESSINGS) ×2 IMPLANT
DRAIN CHANNEL 19F RND (DRAIN) IMPLANT
DRAIN PENROSE 0.5X18 (DRAIN) ×3 IMPLANT
ELECT L-HOOK LAP 45CM DISP (ELECTROSURGICAL)
ELECT REM PT RETURN 15FT ADLT (MISCELLANEOUS) ×3 IMPLANT
ELECTRODE L-HOOK LAP 45CM DISP (ELECTROSURGICAL) IMPLANT
EVACUATOR SILICONE 100CC (DRAIN) IMPLANT
GLOVE SURG POLYISO LF SZ7 (GLOVE) ×3 IMPLANT
GLOVE SURG UNDER POLY LF SZ7 (GLOVE) ×3 IMPLANT
GOWN STRL REUS W/TWL XL LVL3 (GOWN DISPOSABLE) ×9 IMPLANT
GRASPER SUT TROCAR 14GX15 (MISCELLANEOUS) ×3 IMPLANT
KIT BASIN OR (CUSTOM PROCEDURE TRAY) ×3 IMPLANT
KIT TURNOVER KIT A (KITS) ×3 IMPLANT
L-HOOK LAP DISP 36CM (ELECTROSURGICAL)
LEGGING LITHOTOMY PAIR STRL (DRAPES) IMPLANT
LHOOK LAP DISP 36CM (ELECTROSURGICAL) IMPLANT
MARKER SKIN DUAL TIP RULER LAB (MISCELLANEOUS) ×3 IMPLANT
MESH BIO-A 7X10 SYN MAT (Mesh General) ×3 IMPLANT
PAD POSITIONING PINK XL (MISCELLANEOUS) IMPLANT
PENCIL SMOKE EVACUATOR (MISCELLANEOUS) IMPLANT
PROTECTOR NERVE ULNAR (MISCELLANEOUS) IMPLANT
SCISSORS LAP 5X45 EPIX DISP (ENDOMECHANICALS) ×3 IMPLANT
SET IRRIG TUBING LAPAROSCOPIC (IRRIGATION / IRRIGATOR) ×3 IMPLANT
SHEARS HARMONIC ACE PLUS 45CM (MISCELLANEOUS) ×3 IMPLANT
SLEEVE XCEL OPT CAN 5 100 (ENDOMECHANICALS) ×9 IMPLANT
SPONGE DRAIN TRACH 4X4 STRL 2S (GAUZE/BANDAGES/DRESSINGS) ×3 IMPLANT
STRIP CLOSURE SKIN 1/2X4 (GAUZE/BANDAGES/DRESSINGS) ×3 IMPLANT
SUT ETHIBOND 0 36 GRN (SUTURE) ×9 IMPLANT
SUT ETHILON 2 0 PS N (SUTURE) IMPLANT
SUT MNCRL AB 4-0 PS2 18 (SUTURE) ×3 IMPLANT
SUT SILK 0 SH 30 (SUTURE) ×12 IMPLANT
SUT SILK 2 0 SH (SUTURE) ×12 IMPLANT
SUT VICRYL 0 UR6 27IN ABS (SUTURE) ×3 IMPLANT
TAPE CLOTH 4X10 WHT NS (GAUZE/BANDAGES/DRESSINGS) IMPLANT
TIP INNERVISION DETACH 40FR (MISCELLANEOUS) IMPLANT
TIP INNERVISION DETACH 50FR (MISCELLANEOUS) IMPLANT
TIP INNERVISION DETACH 56FR (MISCELLANEOUS) IMPLANT
TIPS INNERVISION DETACH 40FR (MISCELLANEOUS)
TOWEL OR 17X26 10 PK STRL BLUE (TOWEL DISPOSABLE) ×3 IMPLANT
TOWEL OR NON WOVEN STRL DISP B (DISPOSABLE) IMPLANT
TRAY LAPAROSCOPIC (CUSTOM PROCEDURE TRAY) ×3 IMPLANT
TROCAR BLADELESS OPT 5 100 (ENDOMECHANICALS) ×3 IMPLANT
TROCAR XCEL 12X100 BLDLESS (ENDOMECHANICALS) ×3 IMPLANT
TUBE GASTROSTOMY 18F (CATHETERS) ×3 IMPLANT

## 2020-06-21 NOTE — Plan of Care (Signed)

## 2020-06-21 NOTE — Transfer of Care (Signed)
Immediate Anesthesia Transfer of Care Note  Patient: Emily Johns  Procedure(s) Performed: LAPAROSCOPIC REVISIONAL HIATAL HERNIA REPAIR WITH MESH (N/A ) GTUBE INSERTION,UPPER ENDOSCOPY  (N/A Abdomen)  Patient Location: PACU  Anesthesia Type:General  Level of Consciousness: awake and drowsy  Airway & Oxygen Therapy: Patient Spontanous Breathing and Patient connected to face mask oxygen  Post-op Assessment: Report given to RN and Post -op Vital signs reviewed and stable  Post vital signs: Reviewed and stable  Last Vitals:  Vitals Value Taken Time  BP 136/91 06/21/20 1115  Temp    Pulse 78 06/21/20 1117  Resp 16 06/21/20 1117  SpO2 100 % 06/21/20 1117  Vitals shown include unvalidated device data.  Last Pain:  Vitals:   06/21/20 0535  TempSrc: Oral         Complications: No complications documented.

## 2020-06-21 NOTE — Op Note (Signed)
Preoperative diagnosis: recurrent hiatal hernia  Postoperative diagnosis: same   Procedure: laparoscopic recurrent paraesophageal hernia repair with mesh, upper endoscopy, laparoscopic gastrostomy tube creation  Surgeon: Gurney Maxin, M.D.  Asst: Greer Pickerel, M.D.  Anesthesia: general  Indications for procedure: Emily Johns is a 50 y.o. year old female with symptoms of reflux and epigastric pain.  Description of procedure: The patient was brought into the operative suite. Anesthesia was administered with General endotracheal anesthesia. WHO checklist was applied. The patient was then placed in supine position. The area was prepped and draped in the usual sterile fashion.  Next, a left subcostal incision was made. A 67mm trocar was used to gain access to the peritoneal cavity by optical entry technique. Pneumoperitoneum was applied with a high flow and low pressure. The laparoscope was reinserted to confirm position. A 5 mm trocar was placed in the left periumbilical space. Bilateral TAP blocks were placed with Marcaine/Exparel mix. 1 5 mm trocar was placed in the right subcostal area. 1 12 mm trocar was placed in the right mid abdominal space. 1 5 mm trocar was placed in the left lateral space. A Nathanson retractor was placed in the subxiphoid space and used to retract the left lobe of the liver.  The hiatal hernia appeared well scared and contained upper stomach. Care was taken to dissect the stomach and crus free from the scar tissue.  This plane was continued anteriorly and to the left crus. Next, the posterior area was dissected free. Additional care was used to dissect attachments to the chest to the sac and esophagus to improve mobility. A penrose was placed around the GE junction for visualization and retraction. The esophagus was completely freed from surrounding attachments. Care was taken to avoid injury to the vagus nerves. The sac was divided from the GE junction and removed.  The  crus was repaired with 3 interrupted 2-0 ethibond sutures showing appropriate sizing of the crus around the bougie. At this point, Dr. Redmond Pulling performed upper endoscopy showing the the Toupet was intact which was consistent with use seeing some of the silk sutures for the Toupet repair.  Bio-A mesh was inserted and sutured to the crus in 4 areas with 2-0 silk to reinforce the repair.  Due recurrence, a laparoscopic gastrostomy was created by placed 2 2-0 silk purse strings in the anterior lower body. An 18 fr g tube was inserted via the left subcostal incision and placed into a gastotomy and purse strings tied down around it. 3 0 silks were placed around the gastrostomy and passed via suture passer to create transfascial attachments in 3 directions.  Hemostasis was inspected and intact. Pneumoperitoneum was removed. All trocars were removed. All incisions were closed with 4-0 monocryl subcuticular suture. Dermabond was placed for dressing. The patient awoke from anesthesia and was brought to pacu in stable condition.  Findings: recurrent hiatal hernia, intact Toupet wrap, gastrostomy tube at 4 cm at the skin  Specimen: no  Implant: 18 fr g tube, Bio-A mesh   Blood loss: 30 ml  Local anesthesia: 50 ml Exparel:Marcaine Mix  Complications: none  Gurney Maxin, M.D. General, Bariatric, & Minimally Invasive Surgery [facility]

## 2020-06-21 NOTE — Anesthesia Procedure Notes (Signed)
Procedure Name: Intubation Date/Time: 06/21/2020 7:41 AM Performed by: Gwyndolyn Saxon, CRNA Pre-anesthesia Checklist: Patient identified, Emergency Drugs available, Suction available and Patient being monitored Patient Re-evaluated:Patient Re-evaluated prior to induction Oxygen Delivery Method: Circle system utilized Preoxygenation: Pre-oxygenation with 100% oxygen Induction Type: IV induction Ventilation: Mask ventilation without difficulty Laryngoscope Size: Miller and 2 Grade View: Grade I Tube type: Oral Tube size: 6.5 mm Number of attempts: 1 Airway Equipment and Method: Patient positioned with wedge pillow and Stylet Placement Confirmation: ETT inserted through vocal cords under direct vision,  positive ETCO2 and breath sounds checked- equal and bilateral Secured at: 20 cm Tube secured with: Tape Dental Injury: Teeth and Oropharynx as per pre-operative assessment

## 2020-06-21 NOTE — Anesthesia Postprocedure Evaluation (Signed)
Anesthesia Post Note  Patient: Emily Johns  Procedure(s) Performed: LAPAROSCOPIC REVISIONAL HIATAL HERNIA REPAIR WITH MESH (N/A ) GTUBE INSERTION,UPPER ENDOSCOPY  (N/A Abdomen)     Patient location during evaluation: PACU Anesthesia Type: General Level of consciousness: sedated Pain management: pain level controlled Vital Signs Assessment: post-procedure vital signs reviewed and stable Respiratory status: spontaneous breathing and respiratory function stable Cardiovascular status: stable Postop Assessment: no apparent nausea or vomiting Anesthetic complications: no   No complications documented.  Last Vitals:  Vitals:   06/21/20 1245 06/21/20 1300  BP: 111/77 113/77  Pulse: 71 76  Resp: 14 12  Temp: 36.4 C 36.4 C  SpO2: 98% 98%    Last Pain:  Vitals:   06/21/20 1300  TempSrc:   PainSc: Asleep                 Ashlin Hidalgo DANIEL

## 2020-06-21 NOTE — H&P (Signed)
Emily Johns is an 50 y.o. female.   Chief Complaint: recurrent hiatal hernia HPI: 50 yo female with recurrent reflux symptoms. She has recurrent hernia on imaging. She presents for revision  Past Medical History:  Diagnosis Date  . Arthritis    back   . Asthma   . GERD (gastroesophageal reflux disease)   . Hiatal hernia   . Hyperlipemia    patient denies  . PONV (postoperative nausea and vomiting)   . Schatzki's ring   . Vitamin D deficiency     Past Surgical History:  Procedure Laterality Date  . ABDOMINAL HYSTERECTOMY     partial  . APPENDECTOMY    . CESAREAN SECTION     x3  . CHOLECYSTECTOMY    . COLONOSCOPY    . EXPLORATORY LAPAROTOMY    . HERNIA REPAIR  2019  . LAPAROSCOPIC LYSIS OF ADHESIONS  05/06/2012   Procedure: LAPAROSCOPIC LYSIS OF ADHESIONS;  Surgeon: Cheri Fowler, MD;  Location: [facility];  Service: Gynecology;  Laterality: N/A;  . LAPAROSCOPIC NISSEN FUNDOPLICATION N/A 6/57/8469   Procedure: LAPAROSCOPIC hiatal hernia repair with fundoplication;  Surgeon: Kieth Brightly Arta Bruce, MD;  Location: [facility];  Service: General;  Laterality: N/A;  . LAPAROSCOPY  05/06/2012   Procedure: LAPAROSCOPY OPERATIVE;  Surgeon: Cheri Fowler, MD;  Location: [facility];  Service: Gynecology;  Laterality: N/A;  . SALPINGOOPHORECTOMY  05/06/2012   Procedure: SALPINGO OOPHORECTOMY;  Surgeon: Cheri Fowler, MD;  Location: [facility];  Service: Gynecology;  Laterality: Left;    Family History  Problem Relation Age of Onset  . Hyperlipidemia Father   . Hypertension Father   . Hypertension Sister   . Heart disease Paternal Grandfather   . Breast cancer Neg Hx   . Stomach cancer Neg Hx   . Esophageal cancer Neg Hx   . Colon cancer Neg Hx    Social History:  reports that she has never smoked. She has never used smokeless tobacco. She reports that she does not drink alcohol and does not use drugs.  Allergies:  Allergies  Allergen Reactions  . Other Shortness Of Breath and Other (See  Comments)    Selling, throat closes Iodine, betadine--due to shellfish allergy  Selling, throat closes  . Penicillins Anaphylaxis, Swelling and Rash    Has patient had a PCN reaction causing immediate rash, facial/tongue/throat swelling, SOB or lightheadedness with hypotension: Yes Has patient had a PCN reaction causing severe rash involving mucus membranes or skin necrosis: Unknown Has patient had a PCN reaction that required hospitalization: No Has patient had a PCN reaction occurring within the last 10 years: Yes If all of the above answers are "NO", then may proceed with Cephalosporin use.   . Shellfish Allergy Shortness Of Breath    Selling, throat closes  . Iodinated Diagnostic Agents Other (See Comments)    Iodine, betadine--due to shellfish allergy  Other reaction(s): Other (See Comments) Iodine, betadine--due to shellfish allergy  Other reaction(s): Other (See Comments) Iodine, betadine--due to shellfish allergy    . Levaquin [Levofloxacin In D5w] Other (See Comments)    Patient cannot remember reaction.  . Lodine [Etodolac] Palpitations    Medications Prior to Admission  Medication Sig Dispense Refill  . Cholecalciferol (VITAMIN D3 PO) Take 1 tablet by mouth daily.    . fluticasone (FLONASE) 50 MCG/ACT nasal spray Place 2 sprays into both nostrils daily as needed (allergies.).    Marland Kitchen ibuprofen (ADVIL,MOTRIN) 200 MG tablet Take 600 mg by mouth every 8 (eight) hours as  needed (for pain.).    Marland Kitchen pantoprazole (PROTONIX) 40 MG tablet Take 1 tablet (40 mg total) by mouth daily. (Patient taking differently: Take 40 mg by mouth daily as needed (heartburn/indigestion.).) 30 tablet 2  . albuterol (VENTOLIN HFA) 108 (90 Base) MCG/ACT inhaler Inhale 1-2 puffs into the lungs every 6 (six) hours as needed for wheezing or shortness of breath.      No results found for this or any previous visit (from the past 48 hour(s)). No results found.  Review of Systems  Constitutional: Negative  for chills and fever.  HENT: Negative for hearing loss.   Respiratory: Negative for cough.   Cardiovascular: Negative for chest pain and palpitations.  Gastrointestinal: Negative for abdominal pain, nausea and vomiting.  Genitourinary: Negative for dysuria and urgency.  Musculoskeletal: Negative for myalgias and neck pain.  Skin: Negative for rash.  Neurological: Negative for dizziness and headaches.  Hematological: Does not bruise/bleed easily.  Psychiatric/Behavioral: Negative for suicidal ideas.    Blood pressure 110/80, pulse 80, temperature 97.7 F (36.5 C), temperature source Oral, resp. rate 16, height 5\' 3"  (1.6 m), weight 86.2 kg, SpO2 99 %. Physical Exam Vitals reviewed.  Constitutional:      Appearance: She is well-developed.  HENT:     Head: Normocephalic and atraumatic.  Eyes:     Conjunctiva/sclera: Conjunctivae normal.     Pupils: Pupils are equal, round, and reactive to light.  Cardiovascular:     Rate and Rhythm: Normal rate and regular rhythm.  Pulmonary:     Effort: Pulmonary effort is normal.     Breath sounds: Normal breath sounds.  Abdominal:     General: Bowel sounds are normal. There is no distension.     Palpations: Abdomen is soft.     Tenderness: There is no abdominal tenderness.  Musculoskeletal:        General: Normal range of motion.     Cervical back: Normal range of motion and neck supple.  Skin:    General: Skin is warm and dry.  Neurological:     Mental Status: She is alert and oriented to person, place, and time.  Psychiatric:        Behavior: Behavior normal.    Assessment/Plan 50 yo female with recurrent hiatal hernia -lap recurrent hiatal hernia repair, possible mesh insertion, possible g tube -ERAS protocol -inpatient admission  Mickeal Skinner, MD 06/21/2020, 7:22 AM

## 2020-06-22 ENCOUNTER — Encounter (HOSPITAL_COMMUNITY): Payer: Self-pay | Admitting: General Surgery

## 2020-06-22 LAB — CBC
HCT: 31.9 % — ABNORMAL LOW (ref 36.0–46.0)
Hemoglobin: 10.1 g/dL — ABNORMAL LOW (ref 12.0–15.0)
MCH: 28.7 pg (ref 26.0–34.0)
MCHC: 31.7 g/dL (ref 30.0–36.0)
MCV: 90.6 fL (ref 80.0–100.0)
Platelets: 239 10*3/uL (ref 150–400)
RBC: 3.52 MIL/uL — ABNORMAL LOW (ref 3.87–5.11)
RDW: 12.8 % (ref 11.5–15.5)
WBC: 8.3 10*3/uL (ref 4.0–10.5)
nRBC: 0 % (ref 0.0–0.2)

## 2020-06-22 LAB — BASIC METABOLIC PANEL
Anion gap: 5 (ref 5–15)
BUN: 11 mg/dL (ref 6–20)
CO2: 22 mmol/L (ref 22–32)
Calcium: 8.5 mg/dL — ABNORMAL LOW (ref 8.9–10.3)
Chloride: 111 mmol/L (ref 98–111)
Creatinine, Ser: 0.6 mg/dL (ref 0.44–1.00)
GFR, Estimated: >60 mL/min (ref >60)
Glucose, Bld: 120 mg/dL — ABNORMAL HIGH (ref 70–99)
Potassium: 4.3 mmol/L (ref 3.5–5.1)
Sodium: 138 mmol/L (ref 135–145)

## 2020-06-22 MED ORDER — ENSURE ENLIVE PO LIQD
237.0000 mL | Freq: Two times a day (BID) | ORAL | Status: DC
  Administered 2020-06-22: 237 mL via ORAL

## 2020-06-22 MED ORDER — SODIUM CHLORIDE 0.9 % IV SOLN
12.5000 mg | Freq: Four times a day (QID) | INTRAVENOUS | Status: DC | PRN
  Filled 2020-06-22: qty 0.5

## 2020-06-22 NOTE — TOC Initial Note (Signed)
Transition of Care [facility]) - Initial/Assessment Note   Patient Details  Name: Emily Johns MRN: [id number] Date of Birth: [birth year]  Transition of [facility] CM/SW Contact:    Sherie Don, LCSW Phone Number: 06/22/2020, 2:56 PM  Clinical Narrative: Patient is a 50 year old female who was admitted for paraesophageal hernia. TOC received call from patient's [employer] case manager, Juliann Pulse [phone]), regarding discharge needs. CSW met with patient to complete assessment. Per patient, she was independent (ADLs, transportation) prior to her hospital admission. Patient reported she does not anticipate having any discharge needs. CSW attempted to call Juliann Pulse to update her, but was unable to reach her so VM was left regarding needs at this time. TOC to follow.  Expected Discharge Plan: Home/Self Care Barriers to Discharge: Continued Medical Work up  Patient Goals and CMS Choice Patient states their goals for this hospitalization and ongoing recovery are:: Return home  Expected Discharge Plan and Services Expected Discharge Plan: Home/Self Care In-house Referral: Clinical Social Work Living arrangements for the past 2 months: Single Family Home  Prior Living Arrangements/Services Living arrangements for the past 2 months: Single Family Home Lives with:: Spouse Patient language and need for interpreter reviewed:: Yes Do you feel safe going back to the place where you live?: Yes      Need for Family Participation in Patient Care: No (Comment) Care giver support system in place?: Yes (comment) Criminal Activity/Legal Involvement Pertinent to Current Situation/Hospitalization: No - Comment as needed  Activities of Daily Living Home Assistive Devices/Equipment: None ADL Screening (condition at time of admission) Patient's cognitive ability adequate to safely complete daily activities?: Yes Is the patient deaf or have difficulty hearing?: No Does the patient have difficulty seeing, even when  wearing glasses/contacts?: No Does the patient have difficulty concentrating, remembering, or making decisions?: No Patient able to express need for assistance with ADLs?: Yes Does the patient have difficulty dressing or bathing?: No Independently performs ADLs?: Yes (appropriate for developmental age) Does the patient have difficulty walking or climbing stairs?: No Weakness of Legs: None Weakness of Arms/Hands: None  Emotional Assessment Appearance:: Appears stated age Attitude/Demeanor/Rapport: Lethargic Affect (typically observed): Accepting Orientation: : Oriented to Self,Oriented to Place,Oriented to  Time,Oriented to Situation Alcohol / Substance Use: Not Applicable Psych Involvement: No (comment)  Admission diagnosis:  Paraesophageal hernia [K44.9] Patient Active Problem List   Diagnosis Date Noted  . Paraesophageal hernia 06/21/2020  . Special screening for malignant neoplasms, colon 01/09/2020  . Belching 01/09/2020  . Dysphagia 01/09/2020  . Schatzki's ring 01/09/2020  . Dysuria 10/07/2019  . Pelvic pain in female 10/07/2019  . Personal history of urinary infection 10/07/2019  . Hiatal hernia 07/16/2017  . Constipation 08/20/2016  . Change in bowel habits 08/20/2016  . Obesity (BMI 30-39.9) 06/27/2016  . Palpitations 05/14/2016  . Chest pain 05/14/2016  . Acute bronchitis 03/17/2016  . Acute non-recurrent sinusitis 03/17/2016  . Bilateral temporomandibular joint pain 06/07/2015  . Chronic sinusitis 06/07/2015  . Rhinitis, chronic 06/07/2015  . Hyperlipidemia 07/12/2014  . Vitamin D deficiency 07/12/2014  . Pelvic adhesions 05/06/2012   PCP:  Pllc, [place]:   [id number] [place] [id number]: [phone]: [phone] Readmission Risk Interventions No flowsheet data found.

## 2020-06-22 NOTE — Progress Notes (Signed)
Progress Note: General Surgery Service   Chief Complaint/Subjective: Nauseated overnight, pain over left subcostal area  Objective: Vital signs in last 24 hours: Temp:  [96 F (35.6 C)-98.6 F (37 C)] 98.4 F (36.9 C) (03/25 0521) Pulse Rate:  [69-92] 81 (03/25 0521) Resp:  [12-20] 17 (03/25 0521) BP: (105-136)/(71-91) 105/71 (03/25 0521) SpO2:  [95 %-100 %] 98 % (03/25 0521) Last BM Date: 06/19/20  Intake/Output from previous day: 03/24 0701 - 03/25 0700 In: 2535.3 [P.O.:360; I.V.:1725.3; IV Piggyback:450] Out: 3225 [Urine:3200; Blood:25] Intake/Output this shift: No intake/output data recorded.  Gen: NAD  Resp: nonlabored  Card: RRR  Abd: soft, appropriately tender at incisions, g tube in place  Lab Results: CBC  Recent Labs    06/21/20 1510 06/22/20 0455  WBC 8.4 8.3  HGB 10.6* 10.1*  HCT 33.3* 31.9*  PLT 247 239   BMET Recent Labs    06/21/20 1510 06/22/20 0455  NA  --  138  K  --  4.3  CL  --  111  CO2  --  22  GLUCOSE  --  120*  BUN  --  11  CREATININE 0.53 0.60  CALCIUM  --  8.5*   PT/INR No results for input(s): LABPROT, INR in the last 72 hours. ABG No results for input(s): PHART, HCO3 in the last 72 hours.  Invalid input(s): PCO2, PO2  Anti-infectives: Anti-infectives (From admission, onward)   Start     Dose/Rate Route Frequency Ordered Stop   06/21/20 0745  ciprofloxacin (CIPRO) IVPB 400 mg        400 mg 200 mL/hr over 60 Minutes Intravenous 60 min pre-op 06/21/20 0722 06/21/20 0827   06/21/20 0720  ciprofloxacin (CIPRO) 400 MG/200ML IVPB       Note to Pharmacy: Charmayne Sheer   : cabinet override      06/21/20 0720 06/21/20 0828   06/21/20 0553  ceFAZolin (ANCEF) 2-4 GM/100ML-% IVPB       Note to Pharmacy: Charmayne Sheer   : cabinet override      06/21/20 0553 06/21/20 1759      Medications: Scheduled Meds: . acetaminophen  1,000 mg Oral Q6H  . enoxaparin (LOVENOX) injection  40 mg Subcutaneous Q24H  . feeding  supplement  237 mL Oral BID BM   Continuous Infusions: . dextrose 5 % and 0.45 % NaCl with KCl 20 mEq/L 100 mL/hr at 06/22/20 0104  . promethazine (PHENERGAN) injection     PRN Meds:.albuterol, diphenhydrAMINE **OR** diphenhydrAMINE, ketorolac, metoprolol tartrate, morphine injection, ondansetron **OR** ondansetron (ZOFRAN) IV, oxyCODONE, promethazine (PHENERGAN) injection, simethicone  Assessment/Plan: s/p Procedure(s): LAPAROSCOPIC REVISIONAL HIATAL HERNIA REPAIR WITH MESH GTUBE INSERTION,UPPER ENDOSCOPY  06/21/2020 -full liquids -additional antiemetic -ambulate -g tube to gravity as needed for bloating/nausea -remove foley    LOS: 1 day   Mickeal Skinner, MD [facility]

## 2020-06-23 ENCOUNTER — Other Ambulatory Visit (HOSPITAL_COMMUNITY): Payer: Self-pay | Admitting: General Surgery

## 2020-06-23 MED ORDER — TRAMADOL HCL 50 MG PO TABS: 50.0000 mg | ORAL_TABLET | Freq: Four times a day (QID) | ORAL | 0 refills | Status: DC | PRN

## 2020-06-23 MED ORDER — ONDANSETRON 4 MG PO TBDP: 4.0000 mg | ORAL_TABLET | Freq: Four times a day (QID) | ORAL | 0 refills | Status: DC | PRN

## 2020-06-23 MED FILL — traMADol HCL 50 MG TABS: 50 | 5 days supply | Qty: 30 | Fill #0

## 2020-06-23 MED FILL — ONDANSETRON ODT 4 MG TABLET: 4 | 5 days supply | Qty: 20 | Fill #0

## 2020-06-23 NOTE — Progress Notes (Signed)
Pt alert and oriented. D/C instructions given, pt going home.

## 2020-06-23 NOTE — Discharge Summary (Signed)
Physician Discharge Summary  Patient ID: Emily Johns MRN: [id number] DOB/AGE: April 07, 1970 50 y.o.  Admit date: 06/21/2020 Discharge date: 06/23/2020  Admission Diagnoses: Paraesophageal hernia  Discharge Diagnoses:  Active Problems:   Paraesophageal hernia   Discharged Condition: good  Hospital Course: 50 year old female who is status post laparoscopic repair of paraesophageal hernia.  She has a G-tube in place.  Over the past 2 days, her diet has been advanced slowly.  She is reluctant to take my pain medications due to nausea.  By postop day 2 she is tolerating liquids without difficulty or pain with swallowing.  Her G-tube has been to gravity.  This was capped and she tolerated this well.  She was felt to be in stable condition for discharge to home.  Consults: None  Significant Diagnostic Studies: labs: cbc, bmet  Treatments: IV hydration, analgesia: acetaminophen and surgery: lap hiatal hernia repair  Discharge Exam: Blood pressure 118/71, pulse 78, temperature 98.2 F (36.8 C), temperature source Oral, resp. rate 16, height 5\' 3"  (1.6 m), weight 86.2 kg, SpO2 96 %. General appearance: alert and cooperative GI: Soft, nondistended.  G-tube in place with minimal drainage. Incision/Wound: Clean dry and intact  Disposition:    Allergies as of 06/23/2020      Reactions   Other Shortness Of Breath, Other (See Comments)   Selling, throat closes Iodine, betadine--due to shellfish allergy  Selling, throat closes   Penicillins Anaphylaxis, Swelling, Rash   Has patient had a PCN reaction causing immediate rash, facial/tongue/throat swelling, SOB or lightheadedness with hypotension: Yes Has patient had a PCN reaction causing severe rash involving mucus membranes or skin necrosis: Unknown Has patient had a PCN reaction that required hospitalization: No Has patient had a PCN reaction occurring within the last 10 years: Yes If all of the above answers are "NO", then may proceed with  Cephalosporin use.   Shellfish Allergy Shortness Of Breath   Selling, throat closes   Iodinated Diagnostic Agents Other (See Comments)   Iodine, betadine--due to shellfish allergy  Other reaction(s): Other (See Comments) Iodine, betadine--due to shellfish allergy  Other reaction(s): Other (See Comments) Iodine, betadine--due to shellfish allergy    Levaquin [levofloxacin In D5w] Other (See Comments)   Patient cannot remember reaction.   Lodine [etodolac] Palpitations      Medication List    TAKE these medications   albuterol 108 (90 Base) MCG/ACT inhaler Commonly known as: VENTOLIN HFA Inhale 1-2 puffs into the lungs every 6 (six) hours as needed for wheezing or shortness of breath.   fluticasone 50 MCG/ACT nasal spray Commonly known as: FLONASE Place 2 sprays into both nostrils daily as needed (allergies.).   ibuprofen 200 MG tablet Commonly known as: ADVIL Take 600 mg by mouth every 8 (eight) hours as needed (for pain.).   ondansetron 4 MG disintegrating tablet Commonly known as: ZOFRAN-ODT Take 1 tablet (4 mg total) by mouth every 6 (six) hours as needed for nausea.   pantoprazole 40 MG tablet Commonly known as: PROTONIX Take 1 tablet (40 mg total) by mouth daily. What changed:   when to take this  reasons to take this   traMADol 50 MG tablet Commonly known as: ULTRAM Take 1-2 tablets (50-100 mg total) by mouth every 6 (six) hours as needed.   VITAMIN D3 PO Take 1 tablet by mouth daily.        Signed: Rosario Adie 5/36/6440, 11:00 AM

## 2020-06-23 NOTE — Discharge Instructions (Signed)
LAPAROSCOPIC SURGERY: POST OP INSTRUCTIONS  1. DIET: Follow a liquid diet the first 2 weeks after surgery.  Be sure to include lots of fluids daily.  After this, you may start soft foods as tolerated 2. Take your usually prescribed home medications unless otherwise directed. 3. PAIN CONTROL: a. Pain is best controlled by a usual combination of three different methods TOGETHER: i. Ice/Heat ii. Over the counter pain medication iii. Prescription pain medication b. Most patients will experience some swelling and bruising around the incisions.  Ice packs or heating pads (30-60 minutes up to 6 times a day) will help. Use ice for the first few days to help decrease swelling and bruising, then switch to heat to help relax tight/sore spots and speed recovery.  Some people prefer to use ice alone, heat alone, alternating between ice & heat.  Experiment to what works for you.  Swelling and bruising can take several weeks to resolve.   c. It is helpful to take an over-the-counter pain medication regularly for the first few weeks.  Choose one of the following that works best for you: i. Naproxen (Aleve, etc)  Two 220mg  tabs twice a day ii. Ibuprofen (Advil, etc) Three 200mg  tabs four times a day (every meal & bedtime) d. A  prescription for pain medication (such as percocet, vicodin, oxycodone, hydrocodone, etc) should be given to you upon discharge.  Take your pain medication as prescribed.  i. If you are having problems/concerns with the prescription medicine (does not control pain, nausea, vomiting, rash, itching, etc), please call us [phone] to see if we need to switch you to a different pain medicine that will work better for you and/or control your side effect better. ii. If you need a refill on your pain medication, please contact your pharmacy.  They will contact our office to request authorization. Prescriptions will not be filled after 5 pm or on week-ends.   4. Avoid getting constipated.   Between the surgery and the pain medications, it is common to experience some constipation.  Increasing fluid intake and taking a fiber supplement (such as Metamucil, Citrucel, FiberCon, MiraLax, etc) 1-2 times a day regularly will usually help prevent this problem from occurring.  A mild laxative (prune juice, Milk of Magnesia, MiraLax, etc) should be taken according to package directions if there are no bowel movements after 48 hours.   5. Watch out for diarrhea.  If you have many loose bowel movements, simplify your diet to bland foods & liquids for a few days.  Stop any stool softeners and decrease your fiber supplement.  Switching to mild anti-diarrheal medications (Kayopectate, Pepto Bismol) can help.  If this worsens or does not improve, please call us. 6. Keep your G tube capped but you can empty it if you feels nauseated. 7. Wash / shower every day.  You may shower over the dressings as they are waterproof.  Continue to shower over incision(s) after the dressing is off. 8.  You may leave the incision open to air.  You may replace a dressing/Band-Aid to cover the incision for comfort if you wish.  9. ACTIVITIES as tolerated:   a. You may resume regular (light) daily activities beginning the next day--such as daily self-care, walking, climbing stairs--gradually increasing activities as tolerated.  If you can walk 30 minutes without difficulty, it is safe to try more intense activity such as jogging, treadmill, bicycling, low-impact aerobics, swimming, etc. b. Save the most intensive and strenuous activity for last such as  sit-ups, heavy lifting, contact sports, etc  Refrain from any heavy lifting or straining until you are off narcotics for pain control.   c. DO NOT PUSH THROUGH PAIN.  Let pain be your guide: If it hurts to do something, don't do it.  Pain is your body warning you to avoid that activity for another week until the pain goes down. d. You may drive when you are no longer taking  prescription pain medication, you can comfortably wear a seatbelt, and you can safely maneuver your car and apply brakes. e. Dennis Bast may have sexual intercourse when it is comfortable.  10. FOLLOW UP in our office a. Please call [facility] at [phone] to set up an appointment to see your surgeon in the office for a follow-up appointment approximately 2-3 weeks after your surgery. b. Make sure that you call for this appointment the day you arrive home to insure a convenient appointment time. 10. IF YOU HAVE DISABILITY OR FAMILY LEAVE FORMS, BRING THEM TO THE OFFICE FOR PROCESSING.  DO NOT GIVE THEM TO YOUR DOCTOR.   WHEN TO CALL us [phone]: 1. Poor pain control 2. Reactions / problems with new medications (rash/itching, nausea, etc)  3. Fever over 101.5 F (38.5 C) 4. Inability to urinate 5. Nausea and/or vomiting 6. Worsening swelling or bruising 7. Continued bleeding from incision. 8. Increased pain, redness, or drainage from the incision   The clinic staff is available to answer your questions during regular business hours (8:30am-5pm).  Please don't hesitate to call and ask to speak to one of our nurses for clinical concerns.   If you have a medical emergency, go to the nearest emergency room or call 911.  A surgeon from [facility] is always on call at the [facility], [place], [place], [place], [place]  [address] ? MAIN: [phone] ? TOLL FREE: [phone] ?  FAX [phone] [email or web address]

## 2021-02-10 DIAGNOSIS — J0100 Acute maxillary sinusitis, unspecified: Secondary | ICD-10-CM | POA: Diagnosis not present

## 2021-02-10 DIAGNOSIS — Z6831 Body mass index (BMI) 31.0-31.9, adult: Secondary | ICD-10-CM | POA: Diagnosis not present

## 2021-03-26 ENCOUNTER — Other Ambulatory Visit: Payer: Self-pay

## 2021-03-26 ENCOUNTER — Other Ambulatory Visit (HOSPITAL_COMMUNITY): Payer: Self-pay | Admitting: Physician Assistant

## 2021-03-26 ENCOUNTER — Ambulatory Visit (HOSPITAL_COMMUNITY)
Admission: RE | Admit: 2021-03-26 | Discharge: 2021-03-26 | Disposition: A | Discharge status: Home / Self Care | Payer: Federal, State, Local not specified - PPO | Source: Ambulatory Visit | Attending: Physician Assistant | Admitting: Physician Assistant

## 2021-03-26 DIAGNOSIS — E6609 Other obesity due to excess calories: Secondary | ICD-10-CM | POA: Diagnosis not present

## 2021-03-26 DIAGNOSIS — M940 Chondrocostal junction syndrome [Tietze]: Secondary | ICD-10-CM | POA: Insufficient documentation

## 2021-03-26 DIAGNOSIS — R251 Tremor, unspecified: Secondary | ICD-10-CM | POA: Diagnosis not present

## 2021-03-26 DIAGNOSIS — R739 Hyperglycemia, unspecified: Secondary | ICD-10-CM | POA: Diagnosis not present

## 2021-03-26 DIAGNOSIS — H538 Other visual disturbances: Secondary | ICD-10-CM | POA: Diagnosis not present

## 2021-03-26 DIAGNOSIS — Z6830 Body mass index (BMI) 30.0-30.9, adult: Secondary | ICD-10-CM | POA: Diagnosis not present

## 2021-03-26 DIAGNOSIS — R55 Syncope and collapse: Secondary | ICD-10-CM | POA: Diagnosis not present

## 2021-03-26 DIAGNOSIS — H6990 Unspecified Eustachian tube disorder, unspecified ear: Secondary | ICD-10-CM | POA: Diagnosis not present

## 2021-04-30 ENCOUNTER — Encounter: Payer: Self-pay | Admitting: "Endocrinology

## 2021-04-30 ENCOUNTER — Other Ambulatory Visit: Payer: Self-pay

## 2021-04-30 ENCOUNTER — Ambulatory Visit: Payer: Federal, State, Local not specified - PPO | Admitting: "Endocrinology

## 2021-04-30 VITALS — BP 114/72 | HR 80 | Ht 63.5 in | Wt 188.8 lb

## 2021-04-30 DIAGNOSIS — E161 Other hypoglycemia: Secondary | ICD-10-CM | POA: Diagnosis not present

## 2021-04-30 DIAGNOSIS — Z808 Family history of malignant neoplasm of other organs or systems: Secondary | ICD-10-CM | POA: Diagnosis not present

## 2021-04-30 DIAGNOSIS — Z8249 Family history of ischemic heart disease and other diseases of the circulatory system: Secondary | ICD-10-CM | POA: Diagnosis not present

## 2021-04-30 DIAGNOSIS — Z8349 Family history of other endocrine, nutritional and metabolic diseases: Secondary | ICD-10-CM | POA: Diagnosis not present

## 2021-04-30 NOTE — Progress Notes (Signed)
Endocrinology Consult Note                                            04/30/2021, 3:55 PM   Subjective:    Patient ID: Emily Johns, female    DOB: 11-Dec-1970, PCP Emily Munch, PA-C   Past Medical History:  Diagnosis Date   Arthritis    back    Asthma    GERD (gastroesophageal reflux disease)    Hiatal hernia    Hyperlipemia    patient denies   PONV (postoperative nausea and vomiting)    Schatzki's ring    Vitamin D deficiency    Past Surgical History:  Procedure Laterality Date   ABDOMINAL HYSTERECTOMY     partial   APPENDECTOMY     CESAREAN SECTION     x3   CHOLECYSTECTOMY     COLONOSCOPY     EXPLORATORY LAPAROTOMY     GASTROSTOMY N/A 06/21/2020   Procedure: GTUBE INSERTION,UPPER ENDOSCOPY ;  Surgeon: Mickeal Skinner, MD;  Location: [facility];  Service: General;  Laterality: N/A;   HERNIA REPAIR  2019   HIATAL HERNIA REPAIR N/A 06/21/2020   Procedure: LAPAROSCOPIC REVISIONAL HIATAL HERNIA REPAIR WITH MESH;  Surgeon: Kinsinger, Arta Bruce, MD;  Location: [facility];  Service: General;  Laterality: N/A;   LAPAROSCOPIC LYSIS OF ADHESIONS  05/06/2012   Procedure: LAPAROSCOPIC LYSIS OF ADHESIONS;  Surgeon: Cheri Fowler, MD;  Location: [facility];  Service: Gynecology;  Laterality: N/A;   LAPAROSCOPIC NISSEN FUNDOPLICATION N/A 9/93/7169   Procedure: LAPAROSCOPIC hiatal hernia repair with fundoplication;  Surgeon: Kieth Brightly Arta Bruce, MD;  Location: [facility];  Service: General;  Laterality: N/A;   LAPAROSCOPY  05/06/2012   Procedure: LAPAROSCOPY OPERATIVE;  Surgeon: Cheri Fowler, MD;  Location: [facility];  Service: Gynecology;  Laterality: N/A;   SALPINGOOPHORECTOMY  05/06/2012   Procedure: SALPINGO OOPHORECTOMY;  Surgeon: Cheri Fowler, MD;  Location: [facility];  Service: Gynecology;  Laterality: Left;   Social History   Socioeconomic History   Marital status: Married    Spouse name: Not on file   Number of children: 3   Years of education: Not on file   Highest  education level: Not on file  Occupational History   Occupation: Medical illustrator  Tobacco Use   Smoking status: Never   Smokeless tobacco: Never  Vaping Use   Vaping Use: Never used  Substance and Sexual Activity   Alcohol use: No    Alcohol/week: 0.0 standard drinks   Drug use: No   Sexual activity: Yes    Birth control/protection: Surgical, None    Comment: hysterectomy  Other Topics Concern   Not on file  Social History Narrative   Not on file   Social Determinants of Health   Financial Resource Strain: Not on file  Food Insecurity: Not on file  Transportation Needs: Not on file  Physical Activity: Not on file  Stress: Not on file  Social Connections: Not on file   Family History  Problem Relation Age of Onset   Hyperlipidemia Father    Hypertension Father    Hypertension Sister    Heart disease Paternal Grandfather    Breast cancer Neg Hx    Stomach cancer Neg Hx    Esophageal cancer Neg Hx    Colon cancer Neg Hx    Outpatient Encounter  Medications as of 04/30/2021  Medication Sig   albuterol (VENTOLIN HFA) 108 (90 Base) MCG/ACT inhaler Inhale 1-2 puffs into the lungs every 6 (six) hours as needed for wheezing or shortness of breath.   Cholecalciferol (VITAMIN D3 PO) Take 1 tablet by mouth daily.   fluticasone (FLONASE) 50 MCG/ACT nasal spray Place 2 sprays into both nostrils daily as needed (allergies.).   ibuprofen (ADVIL,MOTRIN) 200 MG tablet Take 600 mg by mouth every 8 (eight) hours as needed (for pain.).   pantoprazole (PROTONIX) 40 MG tablet Take 1 tablet (40 mg total) by mouth daily. (Patient not taking: Reported on 04/30/2021)   [DISCONTINUED] ondansetron (ZOFRAN-ODT) 4 MG disintegrating tablet Take 1 tablet (4 mg total) by mouth every 6 (six) hours as needed for nausea.   [DISCONTINUED] ondansetron (ZOFRAN-ODT) 4 MG disintegrating tablet TAKE 1 TABLET (4 MG TOTAL) BY MOUTH EVERY 6 HOURS AS NEEDED FOR NAUSEA.   [DISCONTINUED] traMADol (ULTRAM) 50 MG tablet  Take 1-2 tablets (50-100 mg total) by mouth every 6 (six) hours as needed.   No facility-administered encounter medications on file as of 04/30/2021.   ALLERGIES: Allergies  Allergen Reactions   Other Shortness Of Breath and Other (See Comments)    Selling, throat closes Iodine, betadine--due to shellfish allergy  Selling, throat closes   Penicillins Anaphylaxis, Swelling and Rash    Has patient had a PCN reaction causing immediate rash, facial/tongue/throat swelling, SOB or lightheadedness with hypotension: Yes Has patient had a PCN reaction causing severe rash involving mucus membranes or skin necrosis: Unknown Has patient had a PCN reaction that required hospitalization: No Has patient had a PCN reaction occurring within the last 10 years: Yes If all of the above answers are "NO", then may proceed with Cephalosporin use.    Shellfish Allergy Shortness Of Breath    Selling, throat closes   Iodinated Contrast Media Other (See Comments)    Iodine, betadine--due to shellfish allergy  Other reaction(s): Other (See Comments) Iodine, betadine--due to shellfish allergy  Other reaction(s): Other (See Comments) Iodine, betadine--due to shellfish allergy     Levaquin [Levofloxacin In D5w] Other (See Comments)    Patient cannot remember reaction.   Lodine [Etodolac] Palpitations    VACCINATION STATUS: Immunization History  Administered Date(s) Administered   Tdap 07/11/2014    HPI Emily Johns is 51 y.o. female who presents today with a medical history as above. she is being seen in consultation for hypoglycemia requested by Emily Munch, PA-C.  History is obtained directly from the patient as well as chart review.  Patient with past history of gestational diabetes with her child now 91 years old.  She has dealt with overweight/obesity most of her adult life.  She does not have diabetes currently.  She is not taking diabetes medication.  Over the last several months she has dealt  with tightening glycemic profile including into the 50s with symptoms.  These episodes typically happen after resumption of carbohydrate rich meals. She gets symptoms including shakiness, nervousness, sweating, and mental cloudiness. She denies any history of abdominal surgery nor bypass surgery.  She admits to dietary indiscretion including consumption of juices and sweetened beverages.  Review of Systems  Constitutional: no recent weight gain/loss, no fatigue, no subjective hyperthermia, no subjective hypothermia Eyes: no blurry vision, no xerophthalmia ENT: no sore throat, no nodules palpated in throat, no dysphagia/odynophagia, no hoarseness Cardiovascular: no Chest Pain, no Shortness of Breath, no palpitations, no leg swelling Respiratory: no cough, no shortness of breath  Gastrointestinal: no Nausea/Vomiting/Diarhhea Musculoskeletal: no muscle/joint aches Skin: no rashes Neurological: no tremors, no numbness, no tingling, no dizziness Psychiatric: no depression, no anxiety  Objective:    Vitals with BMI 04/30/2021 06/23/2020 06/22/2020  Height 5' 3.5" - -  Weight 188 lbs 13 oz - -  BMI 72.53 - -  Systolic [phone]  Diastolic 72 71 75  Pulse 80 78 70    BP 114/72    Pulse 80    Ht 5' 3.5" (1.613 m)    Wt 188 lb 12.8 oz (85.6 kg)    BMI 32.92 kg/m   Wt Readings from Last 3 Encounters:  04/30/21 188 lb 12.8 oz (85.6 kg)  06/21/20 190 lb (86.2 kg)  02/08/20 193 lb (87.5 kg)    Physical Exam  Constitutional:  Body mass index is 32.92 kg/m.,  not in acute distress, normal state of mind Eyes: PERRLA, EOMI, no exophthalmos ENT: moist mucous membranes, no gross thyromegaly, no gross cervical lymphadenopathy Cardiovascular: normal precordial activity, Regular Rate and Rhythm, no Murmur/Rubs/Gallops Respiratory:  adequate breathing efforts, no gross chest deformity, Clear to auscultation bilaterally Gastrointestinal: abdomen soft, Non -tender, No distension, Bowel Sounds present,  no gross organomegaly Musculoskeletal: no gross deformities, strength intact in all four extremities Skin: moist, warm, no rashes Neurological: no tremor with outstretched hands, Deep tendon reflexes normal in bilateral lower extremities.  CMP ( most recent) CMP     Component Value Date/Time   NA 138 06/22/2020 0455   NA 140 07/11/2014 0918   K 4.3 06/22/2020 0455   CL 111 06/22/2020 0455   CO2 22 06/22/2020 0455   GLUCOSE 120 (H) 06/22/2020 0455   BUN 11 06/22/2020 0455   BUN 9 07/11/2014 0918   CREATININE 0.60 06/22/2020 0455   CALCIUM 8.5 (L) 06/22/2020 0455   PROT 7.1 05/30/2016 0909   PROT 6.6 07/11/2014 0918   ALBUMIN 3.9 05/30/2016 0909   ALBUMIN 4.2 07/11/2014 0918   AST 17 05/30/2016 0909   ALT 15 05/30/2016 0909   ALKPHOS 59 05/30/2016 0909   BILITOT 1.1 05/30/2016 0909   BILITOT 0.8 07/11/2014 0918   GFRNONAA >60 06/22/2020 0455   GFRAA >60 07/17/2017 0502     Diabetic Labs (most recent): Lab Results  Component Value Date   HGBA1C 4.8 05/30/2016   HGBA1C 4.5% 02/10/2013     Lipid Panel ( most recent) Lipid Panel     Component Value Date/Time   CHOL 181 05/30/2016 0909   CHOL 210 (H) 07/11/2014 0918   TRIG 50 05/30/2016 0909   HDL 46 05/30/2016 0909   HDL 53 07/11/2014 0918   CHOLHDL 3.9 05/30/2016 0909   VLDL 10 05/30/2016 0909   LDLCALC 125 (H) 05/30/2016 0909   LDLCALC 141 (H) 07/11/2014 0918   LABVLDL 16 07/11/2014 0918      Lab Results  Component Value Date   TSH 1.968 05/30/2016   TSH 2.320 07/11/2014      Assessment & Plan:   Reactive hypoglycemia  - SAHER DAVEE  is being seen at a kind request of Emily Munch, PA-C. - I have reviewed her available glycemic records and clinically evaluated the patient. Her meter shows blood glucose dropping into 60s and 50s and historically reported to follow meals associated with processed carbs or sweetened beverages.   Her presentation is consistent with reactive hypoglycemia.  I had a  long discussion with her about dietary modification. - she acknowledges that there is a room for improvement  in her food and drink choices. - Suggestion is made for her to avoid simple carbohydrates  from her diet including Cakes, Sweet Desserts, Ice Cream, Soda (diet and regular), Sweet Tea, Candies, Chips, Cookies, Store Bought Juices, Alcohol , Artificial Sweeteners,  Coffee Creamer, and "Sugar-free" Products, Lemonade. This will help patient to have more stable blood glucose profile and potentially avoid unintended weight gain.  The following Lifestyle Medicine recommendations according to [facility]) were discussed and and offered to patient and she  agrees to start the journey:  A. Whole Foods, Plant-Based Nutrition comprising of fruits and vegetables, plant-based proteins, whole-grain carbohydrates was discussed in detail with the patient.   A list for source of those nutrients were also provided to the patient.  Patient will use only water or unsweetened tea for hydration. B.  A full color page of  Calorie density of various food groups per pound showing examples of each food groups was provided to the patient. Increased intake of plant-based protein will stabilize her glycemic profile. -She does not have diabetes nor prediabetes at this time.  Her history of gestational diabetes puts her at risk of future type 2 diabetes.  I gave her a CGM of Libre 3 to monitor blood glucose for the next 10 to 15 days and learn about food.  I have applied the first sensor on her and educated her on use, downloading the app for [place] [employer].  She does not need work-up for hypoglycemia at this time.  If she presents with significant hypoglycemia, she will be offered work-up including adrenal insufficieny endogenous insulin reserve.   - I did not initiate any new prescriptions today. - she is advised to maintain close follow up with Emily Munch, PA-C for primary care  needs.   - Time spent with the patient: 60 minutes, of which >50% was spent in  counseling her about her hypoglycemia and the rest in obtaining information about her symptoms, reviewing her previous labs/studies ( including abstractions from other facilities),  evaluations, and treatments,  and developing a plan to confirm diagnosis and long term treatment based on the latest standards of care/guidelines; and documenting her care.  Emily Johns in the discussions, expressed understanding, and voiced agreement with the above plans.  All questions were answered to her satisfaction. she is encouraged to contact clinic should she have any questions or concerns prior to her return visit.  Follow up plan: Return in about 10 days (around 05/10/2021), or She will bring her Libre 3, for F/U with Meter and Logs Only - no Labs.   Glade Lloyd, MD [facility] [address] [place], [place] [address] Phone: [phone]  Fax: [phone]     04/30/2021, 3:55 PM  This note was partially dictated with voice recognition software. Similar sounding words can be transcribed inadequately or may not  be corrected upon review.

## 2021-04-30 NOTE — Patient Instructions (Signed)

## 2021-05-02 DIAGNOSIS — E161 Other hypoglycemia: Secondary | ICD-10-CM | POA: Insufficient documentation

## 2021-05-13 ENCOUNTER — Other Ambulatory Visit: Payer: Self-pay

## 2021-05-13 ENCOUNTER — Encounter: Payer: Self-pay | Admitting: "Endocrinology

## 2021-05-13 ENCOUNTER — Ambulatory Visit: Payer: Federal, State, Local not specified - PPO | Admitting: "Endocrinology

## 2021-05-13 VITALS — BP 112/76 | HR 72 | Ht 63.5 in | Wt 191.8 lb

## 2021-05-13 DIAGNOSIS — E161 Other hypoglycemia: Secondary | ICD-10-CM

## 2021-05-13 DIAGNOSIS — E782 Mixed hyperlipidemia: Secondary | ICD-10-CM | POA: Diagnosis not present

## 2021-05-13 MED ORDER — FREESTYLE LIBRE 3 SENSOR MISC: 1.0000 | 2 refills | Status: DC

## 2021-05-13 NOTE — Progress Notes (Signed)
05/13/2021, 5:38 PM   Endocrinology follow-up note  Subjective:    Patient ID: Emily Johns, female    DOB: 01/24/1971, PCP Cory Munch, PA-C   Past Medical History:  Diagnosis Date   Arthritis    back    Asthma    GERD (gastroesophageal reflux disease)    Hiatal hernia    Hyperlipemia    patient denies   PONV (postoperative nausea and vomiting)    Schatzki's ring    Vitamin D deficiency    Past Surgical History:  Procedure Laterality Date   ABDOMINAL HYSTERECTOMY     partial   APPENDECTOMY     CESAREAN SECTION     x3   CHOLECYSTECTOMY     COLONOSCOPY     EXPLORATORY LAPAROTOMY     GASTROSTOMY N/A 06/21/2020   Procedure: GTUBE INSERTION,UPPER ENDOSCOPY ;  Surgeon: Mickeal Skinner, MD;  Location: [facility];  Service: General;  Laterality: N/A;   HERNIA REPAIR  2019   HIATAL HERNIA REPAIR N/A 06/21/2020   Procedure: LAPAROSCOPIC REVISIONAL HIATAL HERNIA REPAIR WITH MESH;  Surgeon: Kinsinger, Arta Bruce, MD;  Location: [facility];  Service: General;  Laterality: N/A;   LAPAROSCOPIC LYSIS OF ADHESIONS  05/06/2012   Procedure: LAPAROSCOPIC LYSIS OF ADHESIONS;  Surgeon: Cheri Fowler, MD;  Location: [facility];  Service: Gynecology;  Laterality: N/A;   LAPAROSCOPIC NISSEN FUNDOPLICATION N/A 6/76/1950   Procedure: LAPAROSCOPIC hiatal hernia repair with fundoplication;  Surgeon: Kieth Brightly Arta Bruce, MD;  Location: [facility];  Service: General;  Laterality: N/A;   LAPAROSCOPY  05/06/2012   Procedure: LAPAROSCOPY OPERATIVE;  Surgeon: Cheri Fowler, MD;  Location: [facility];  Service: Gynecology;  Laterality: N/A;   SALPINGOOPHORECTOMY  05/06/2012   Procedure: SALPINGO OOPHORECTOMY;  Surgeon: Cheri Fowler, MD;  Location: [facility];  Service: Gynecology;  Laterality: Left;   Social History   Socioeconomic History   Marital status: Married    Spouse name: Not on file   Number of children: 3   Years of education: Not on file    Highest education level: Not on file  Occupational History   Occupation: Medical illustrator  Tobacco Use   Smoking status: Never   Smokeless tobacco: Never  Vaping Use   Vaping Use: Never used  Substance and Sexual Activity   Alcohol use: No    Alcohol/week: 0.0 standard drinks   Drug use: No   Sexual activity: Yes    Birth control/protection: Surgical, None    Comment: hysterectomy  Other Topics Concern   Not on file  Social History Narrative   Not on file   Social Determinants of Health   Financial Resource Strain: Not on file  Food Insecurity: Not on file  Transportation Needs: Not on file  Physical Activity: Not on file  Stress: Not on file  Social Connections: Not on file   Family History  Problem Relation Age of Onset   Hyperlipidemia Father    Hypertension Father    Hypertension Sister    Heart disease Paternal Grandfather    Breast cancer Neg Hx    Stomach cancer Neg Hx    Esophageal cancer Neg Hx    Colon cancer Neg Hx    Outpatient  Encounter Medications as of 05/13/2021  Medication Sig   pantoprazole (PROTONIX) 40 MG tablet Take 1 tablet (40 mg total) by mouth daily.   albuterol (VENTOLIN HFA) 108 (90 Base) MCG/ACT inhaler Inhale 1-2 puffs into the lungs every 6 (six) hours as needed for wheezing or shortness of breath.   Cholecalciferol (VITAMIN D3 PO) Take 1 tablet by mouth daily.   fluticasone (FLONASE) 50 MCG/ACT nasal spray Place 2 sprays into both nostrils daily as needed (allergies.).   ibuprofen (ADVIL,MOTRIN) 200 MG tablet Take 600 mg by mouth every 8 (eight) hours as needed (for pain.).   No facility-administered encounter medications on file as of 05/13/2021.   ALLERGIES: Allergies  Allergen Reactions   Other Shortness Of Breath and Other (See Comments)    Selling, throat closes Iodine, betadine--due to shellfish allergy  Selling, throat closes   Penicillins Anaphylaxis, Swelling and Rash    Has patient had a PCN reaction causing immediate  rash, facial/tongue/throat swelling, SOB or lightheadedness with hypotension: Yes Has patient had a PCN reaction causing severe rash involving mucus membranes or skin necrosis: Unknown Has patient had a PCN reaction that required hospitalization: No Has patient had a PCN reaction occurring within the last 10 years: Yes If all of the above answers are "NO", then may proceed with Cephalosporin use.    Shellfish Allergy Shortness Of Breath    Selling, throat closes   Iodinated Contrast Media Other (See Comments)    Iodine, betadine--due to shellfish allergy  Other reaction(s): Other (See Comments) Iodine, betadine--due to shellfish allergy  Other reaction(s): Other (See Comments) Iodine, betadine--due to shellfish allergy     Levaquin [Levofloxacin In D5w] Other (See Comments)    Patient cannot remember reaction.   Lodine [Etodolac] Palpitations    VACCINATION STATUS: Immunization History  Administered Date(s) Administered   Tdap 07/11/2014    HPI Emily Johns is 51 y.o. female who presents today with a medical history as above. she is being seen in follow-up after she was seen  in consultation for hypoglycemia requested by Cory Munch, PA-C.  See notes from previous visit.    Patient with past history of gestational diabetes with her child now 62 years old.  She has dealt with overweight/obesity most of her adult life.  She does not have diabetes currently.  She is not taking diabetes medication.  Over the last several months she has dealt with tightening glycemic profile including into the 50s with symptoms.  These episodes typically happen after resumption of carbohydrate rich meals.  She did not have significant hypoglycemia since last visit.  She was given a Libre 3 CGM she has used for the last 14 days.  Her AGP report shows 90% within range, 10% mild hypoglycemia between 55 and 69.  She has no sustained hypoglycemia below 55.  She denies any history of abdominal surgery  nor bypass surgery.  She admits to dietary indiscretion including consumption of juices and sweetened beverages.  Review of Systems  Constitutional: no recent weight gain/loss, no fatigue, no subjective hyperthermia, no subjective hypothermia Eyes: no blurry vision, no xerophthalmia ENT: no sore throat, no nodules palpated in throat, no dysphagia/odynophagia, no hoarseness Cardiovascular: no Chest Pain, no Shortness of Breath, no palpitations, no leg swelling Respiratory: no cough, no shortness of breath Gastrointestinal: no Nausea/Vomiting/Diarhhea Musculoskeletal: no muscle/joint aches Skin: no rashes Neurological: no tremors, no numbness, no tingling, no dizziness Psychiatric: no depression, no anxiety  Objective:    Vitals with BMI 05/13/2021 04/30/2021  06/23/2020  Height 5' 3.5" 5' 3.5" -  Weight 191 lbs 13 oz 188 lbs 13 oz -  BMI 92.11 94.17 -  Systolic 408 144 818  Diastolic 76 72 71  Pulse 72 80 78    BP 112/76    Pulse 72    Ht 5' 3.5" (1.613 m)    Wt 191 lb 12.8 oz (87 kg)    BMI 33.44 kg/m   Wt Readings from Last 3 Encounters:  05/13/21 191 lb 12.8 oz (87 kg)  04/30/21 188 lb 12.8 oz (85.6 kg)  06/21/20 190 lb (86.2 kg)    Physical Exam  Constitutional:  Body mass index is 33.44 kg/m.,  not in acute distress,   CMP ( most recent) CMP     Component Value Date/Time   NA 138 06/22/2020 0455   NA 140 07/11/2014 0918   K 4.3 06/22/2020 0455   CL 111 06/22/2020 0455   CO2 22 06/22/2020 0455   GLUCOSE 120 (H) 06/22/2020 0455   BUN 11 06/22/2020 0455   BUN 9 07/11/2014 0918   CREATININE 0.60 06/22/2020 0455   CALCIUM 8.5 (L) 06/22/2020 0455   PROT 7.1 05/30/2016 0909   PROT 6.6 07/11/2014 0918   ALBUMIN 3.9 05/30/2016 0909   ALBUMIN 4.2 07/11/2014 0918   AST 17 05/30/2016 0909   ALT 15 05/30/2016 0909   ALKPHOS 59 05/30/2016 0909   BILITOT 1.1 05/30/2016 0909   BILITOT 0.8 07/11/2014 0918   GFRNONAA >60 06/22/2020 0455   GFRAA >60 07/17/2017 0502      Diabetic Labs (most recent): Lab Results  Component Value Date   HGBA1C 4.8 05/30/2016   HGBA1C 4.5% 02/10/2013     Lipid Panel ( most recent) Lipid Panel     Component Value Date/Time   CHOL 181 05/30/2016 0909   CHOL 210 (H) 07/11/2014 0918   TRIG 50 05/30/2016 0909   HDL 46 05/30/2016 0909   HDL 53 07/11/2014 0918   CHOLHDL 3.9 05/30/2016 0909   VLDL 10 05/30/2016 0909   LDLCALC 125 (H) 05/30/2016 0909   LDLCALC 141 (H) 07/11/2014 0918   LABVLDL 16 07/11/2014 0918      Lab Results  Component Value Date   TSH 1.968 05/30/2016   TSH 2.320 07/11/2014      Assessment & Plan:   Reactive hypoglycemia  I reviewed her AGP reports with the patient.  She presents with findings consistent with reactive hypoglycemia.  She is advised to introduce more carbs of from whole grains at supper and also snack in the evening hours with nuts.  Reactive hypoglycemia is best managed with modified diet.  - she acknowledges that there is a room for improvement in her food and drink choices. - Suggestion is made for her to avoid simple carbohydrates  from her diet including Cakes, Sweet Desserts, Ice Cream, Soda (diet and regular), Sweet Tea, Candies, Chips, Cookies, Store Bought Juices, Alcohol , Artificial Sweeteners,  Coffee Creamer, and "Sugar-free" Products, Lemonade. This will help patient to have more stable blood glucose profile and potentially avoid unintended weight gain.  The following Lifestyle Medicine recommendations according to [facility]) were discussed and and offered to patient and she  agrees to start the journey:  A. Whole Foods, Plant-Based Nutrition comprising of fruits and vegetables, plant-based proteins, whole-grain carbohydrates was discussed in detail with the patient.   A list for source of those nutrients were also provided to the patient.  Patient  will use only water or unsweetened tea for hydration. B.  The need to stay  away from risky substances including alcohol, smoking; obtaining 7 to 9 hours of restorative sleep, at least 150 minutes of moderate intensity exercise weekly, the importance of healthy social connections,  and stress management techniques were discussed. C.  A full color page of  Calorie density of various food groups per pound showing examples of each food groups was provided to the patient.   Increased intake of plant-based protein will stabilize her glycemic profile. -She does not have diabetes nor prediabetes at this time.  Her history of gestational diabetes puts her at risk of future type 2 diabetes.  She will continue to benefit from a CGM device.  I discussed and prescribed more sensors for her.   She does not need work-up for hypoglycemia at this time.   She will be considered for CMP, a.m. cortisol, thyroid functions, and fasting lipid panel before her next visit.   - she is advised to maintain close follow up with Cory Munch, PA-C for primary care needs.    I spent 31 minutes in the care of the patient today including review of labs from Thyroid Function, CMP, and other relevant labs ; imaging/biopsy records (current and previous including abstractions from other facilities); face-to-face time discussing  her lab results and symptoms, medications doses, her options of short and long term treatment based on the latest standards of care / guidelines;   and documenting the encounter.  Emily Johns  participated in the discussions, expressed understanding, and voiced agreement with the above plans.  All questions were answered to her satisfaction. she is encouraged to contact clinic should she have any questions or concerns prior to her return visit.   Follow up plan: Return in about 3 months (around 08/10/2021) for Bring Meter and Logs- A1c in Office.   Glade Lloyd, MD [facility] [address] [place], [place]  [address] Phone: [phone]  Fax: [phone]     05/13/2021, 5:38 PM  This note was partially dictated with voice recognition software. Similar sounding words can be transcribed inadequately or may not  be corrected upon review.

## 2021-05-13 NOTE — Patient Instructions (Signed)
°                                       Advice for Weight Management  -For most of [place] the best way to lose weight is by diet management. Generally speaking, diet management means consuming less calories intentionally which over time brings about progressive weight loss.  This can be achieved more effectively by avoiding ultra processed carbohydrates, processed meats, unhealthy fats.    It is critically important to know your numbers: how much calorie you are consuming and how much calorie you need. More importantly, our carbohydrates sources should be unprocessed naturally occurring  complex starch food items.  It is always important to balance nutrition also by  appropriate intake of proteins (mainly plant-based), healthy fats/oils, plenty of fruits and vegetables.   -The American College of Lifestyle Medicine (ACL M) recommends nutrition derived mostly from Whole Food, Plant Predominant Sources example an apple instead of applesauce or apple pie. Eat Plenty of vegetables, Mushrooms, fruits, Legumes, Whole Grains, Nuts, seeds in lieu of processed meats, processed snacks/pastries red meat, poultry, eggs.  Use only water or unsweetened tea for hydration.  The College also recommends the need to stay away from risky substances including alcohol, smoking; obtaining 7-9 hours of restorative sleep, at least 150 minutes of moderate intensity exercise weekly, importance of healthy social connections, and being mindful of stress and seek help when it is overwhelming.    -Sticking to a routine mealtime to eat 3 meals a day and avoiding unnecessary snacks is shown to have a big role in weight control. Under normal circumstances, the only time we lose real weight is when we are hungry, so allow hunger to take place- hunger means no food between meal times, only water.  It is not advisable to starve.   -It is better to avoid simple carbohydrates including: Cakes, Sweet Desserts,  Ice Cream, Soda (diet and regular), Sweet Tea, Candies, Chips, Cookies, Store Bought Juices, Alcohol in Excess of  1-2 drinks a day, Lemonade,  Artificial Sweeteners, Doughnuts, Coffee Creamers, "Sugar-free" Products, etc, etc.  This is not a complete list...Marland Kitchen.    -Consulting with certified diabetes educators is proven to provide you with the most accurate and current information on diet.  Also, you may be  interested in discussing diet options/exchanges , we can schedule a visit with Jearld Fenton, RDN, CDE for individualized nutrition education.  -Exercise: If you are able: 30 -60 minutes a day ,4 days a week, or 150 minutes of moderate intensity exercise weekly.    The longer the better if tolerated.  Combine stretch, strength, and aerobic activities.  If you were told in the past that you have high risk for cardiovascular diseases, or if you are currently symptomatic, you may seek evaluation by your heart doctor prior to initiating moderate to intense exercise programs.

## 2021-06-12 DIAGNOSIS — Z1231 Encounter for screening mammogram for malignant neoplasm of breast: Secondary | ICD-10-CM | POA: Diagnosis not present

## 2021-06-25 DIAGNOSIS — Z1389 Encounter for screening for other disorder: Secondary | ICD-10-CM | POA: Diagnosis not present

## 2021-06-25 DIAGNOSIS — N760 Acute vaginitis: Secondary | ICD-10-CM | POA: Diagnosis not present

## 2021-06-25 DIAGNOSIS — Z01419 Encounter for gynecological examination (general) (routine) without abnormal findings: Secondary | ICD-10-CM | POA: Diagnosis not present

## 2021-06-25 DIAGNOSIS — Z6832 Body mass index (BMI) 32.0-32.9, adult: Secondary | ICD-10-CM | POA: Diagnosis not present

## 2021-08-12 ENCOUNTER — Ambulatory Visit: Payer: Federal, State, Local not specified - PPO | Admitting: "Endocrinology

## 2021-08-30 DIAGNOSIS — E161 Other hypoglycemia: Secondary | ICD-10-CM | POA: Diagnosis not present

## 2021-09-02 LAB — COMPREHENSIVE METABOLIC PANEL
ALT: 18 IU/L (ref 0–32)
AST: 26 IU/L (ref 0–40)
Albumin/Globulin Ratio: 1.5 (ref 1.2–2.2)
Albumin: 4 g/dL (ref 3.8–4.8)
Alkaline Phosphatase: 82 IU/L (ref 44–121)
BUN/Creatinine Ratio: 10 (ref 9–23)
BUN: 7 mg/dL (ref 6–24)
Bilirubin Total: 0.4 mg/dL (ref 0.0–1.2)
CO2: 24 mmol/L (ref 20–29)
Calcium: 9.2 mg/dL (ref 8.7–10.2)
Chloride: 105 mmol/L (ref 96–106)
Creatinine, Ser: 0.68 mg/dL (ref 0.57–1.00)
Globulin, Total: 2.6 g/dL (ref 1.5–4.5)
Glucose: 76 mg/dL (ref 70–99)
Potassium: 4.3 mmol/L (ref 3.5–5.2)
Sodium: 140 mmol/L (ref 134–144)
Total Protein: 6.6 g/dL (ref 6.0–8.5)
eGFR: 107 mL/min/{1.73_m2} (ref >59)

## 2021-09-02 LAB — LIPID PANEL
Chol/HDL Ratio: 2.5 ratio (ref 0.0–4.4)
Cholesterol, Total: 178 mg/dL (ref 100–199)
HDL: 71 mg/dL (ref >39)
LDL Chol Calc (NIH): 97 mg/dL (ref 0–99)
Triglycerides: 50 mg/dL (ref 0–149)
VLDL Cholesterol Cal: 10 mg/dL (ref 5–40)

## 2021-09-02 LAB — T4, FREE: Free T4: 1.21 ng/dL (ref 0.82–1.77)

## 2021-09-02 LAB — CORTISOL-AM, BLOOD: Cortisol - AM: 8.7 ug/dL (ref 6.2–19.4)

## 2021-09-02 LAB — TSH: TSH: 1.91 u[IU]/mL (ref 0.450–4.500)

## 2021-10-03 ENCOUNTER — Encounter: Payer: Self-pay | Admitting: "Endocrinology

## 2021-10-03 ENCOUNTER — Ambulatory Visit: Payer: Federal, State, Local not specified - PPO | Admitting: "Endocrinology

## 2021-10-03 VITALS — BP 104/68 | HR 92 | Ht 63.5 in | Wt 197.4 lb

## 2021-10-03 DIAGNOSIS — E161 Other hypoglycemia: Secondary | ICD-10-CM | POA: Diagnosis not present

## 2021-10-03 DIAGNOSIS — E782 Mixed hyperlipidemia: Secondary | ICD-10-CM

## 2021-10-03 LAB — POCT GLYCOSYLATED HEMOGLOBIN (HGB A1C): HbA1c, POC (controlled diabetic range): 5.2 % (ref 0.0–7.0)

## 2021-10-03 MED ORDER — FREESTYLE LIBRE 3 SENSOR MISC: 1.0000 | 1 refills | Status: DC

## 2021-10-03 NOTE — Patient Instructions (Signed)
                                     Advice for Weight Management  -For most of us the best way to lose weight is by diet management. Generally speaking, diet management means consuming less calories intentionally which over time brings about progressive weight loss.  This can be achieved more effectively by avoiding ultra processed carbohydrates, processed meats, unhealthy fats.    It is critically important to know your numbers: how much calorie you are consuming and how much calorie you need. More importantly, our carbohydrates sources should be unprocessed naturally occurring  complex starch food items.  It is always important to balance nutrition also by  appropriate intake of proteins (mainly plant-based), healthy fats/oils, plenty of fruits and vegetables.   -The American College of Lifestyle Medicine (ACL M) recommends nutrition derived mostly from Whole Food, Plant Predominant Sources example an apple instead of applesauce or apple pie. Eat Plenty of vegetables, Mushrooms, fruits, Legumes, Whole Grains, Nuts, seeds in lieu of processed meats, processed snacks/pastries red meat, poultry, eggs.  Use only water or unsweetened tea for hydration.  The College also recommends the need to stay away from risky substances including alcohol, smoking; obtaining 7-9 hours of restorative sleep, at least 150 minutes of moderate intensity exercise weekly, importance of healthy social connections, and being mindful of stress and seek help when it is overwhelming.    -Sticking to a routine mealtime to eat 3 meals a day and avoiding unnecessary snacks is shown to have a big role in weight control. Under normal circumstances, the only time we burn stored energy is when we are hungry, so allow  some hunger to take place- hunger means no food between appropriate meal times, only water.  It is not advisable to starve.   -It is better to avoid simple carbohydrates including:  Cakes, Sweet Desserts, Ice Cream, Soda (diet and regular), Sweet Tea, Candies, Chips, Cookies, Store Bought Juices, Alcohol in Excess of  1-2 drinks a day, Lemonade,  Artificial Sweeteners, Doughnuts, Coffee Creamers, "Sugar-free" Products, etc, etc.  This is not a complete list.....    -Consulting with certified diabetes educators is proven to provide you with the most accurate and current information on diet.  Also, you may be  interested in discussing diet options/exchanges , we can schedule a visit with Emily Johns, RDN, CDE for individualized nutrition education.  -Exercise: If you are able: 30 -60 minutes a day ,4 days a week, or 150 minutes of moderate intensity exercise weekly.    The longer the better if tolerated.  Combine stretch, strength, and aerobic activities.  If you were told in the past that you have high risk for cardiovascular diseases, or if you are currently symptomatic, you may seek evaluation by your heart doctor prior to initiating moderate to intense exercise programs.                                  Additional Care Considerations for Diabetes/Prediabetes   -Diabetes  is a chronic disease.  The most important care consideration is regular follow-up with your diabetes care provider with the goal being avoiding or delaying its complications and to take advantage of advances in medications and technology.  If appropriate actions are taken early enough, type 2 diabetes can even be   reversed.  Seek information from the right source.  - Whole Food, Plant Predominant Nutrition is highly recommended: Eat Plenty of vegetables, Mushrooms, fruits, Legumes, Whole Grains, Nuts, seeds in lieu of processed meats, processed snacks/pastries red meat, poultry, eggs as recommended by American College of  Lifestyle Medicine (ACLM).  -Type 2 diabetes is known to coexist with other important comorbidities such as high blood pressure and high cholesterol.  It is critical to control not only the  diabetes but also the high blood pressure and high cholesterol to minimize and delay the risk of complications including coronary artery disease, stroke, amputations, blindness, etc.  The good news is that this diet recommendation for type 2 diabetes is also very helpful for managing high cholesterol and high blood blood pressure.  - Studies showed that people with diabetes will benefit from a class of medications known as ACE inhibitors and statins.  Unless there are specific reasons not to be on these medications, the standard of care is to consider getting one from these groups of medications at an optimal doses.  These medications are generally considered safe and proven to help protect the heart and the kidneys.    - People with diabetes are encouraged to initiate and maintain regular follow-up with eye doctors, foot doctors, dentists , and if necessary heart and kidney doctors.     - It is highly recommended that people with diabetes quit smoking or stay away from smoking, and get yearly  flu vaccine and pneumonia vaccine at least every 5 years.  See above for additional recommendations on exercise, sleep, stress management , and healthy social connections.      

## 2021-10-03 NOTE — Progress Notes (Signed)
10/03/2021, 4:28 PM   Endocrinology follow-up note  Subjective:    Patient ID: Emily Johns, female    DOB: 1971/01/23, PCP Cory Munch, PA-C   Past Medical History:  Diagnosis Date   Arthritis    back    Asthma    GERD (gastroesophageal reflux disease)    Hiatal hernia    Hyperlipemia    patient denies   PONV (postoperative nausea and vomiting)    Schatzki's ring    Vitamin D deficiency    Past Surgical History:  Procedure Laterality Date   ABDOMINAL HYSTERECTOMY     partial   APPENDECTOMY     CESAREAN SECTION     x3   CHOLECYSTECTOMY     COLONOSCOPY     EXPLORATORY LAPAROTOMY     GASTROSTOMY N/A 06/21/2020   Procedure: GTUBE INSERTION,UPPER ENDOSCOPY ;  Surgeon: Mickeal Skinner, MD;  Location: [facility];  Service: General;  Laterality: N/A;   HERNIA REPAIR  2019   HIATAL HERNIA REPAIR N/A 06/21/2020   Procedure: LAPAROSCOPIC REVISIONAL HIATAL HERNIA REPAIR WITH MESH;  Surgeon: Kinsinger, Arta Bruce, MD;  Location: [facility];  Service: General;  Laterality: N/A;   LAPAROSCOPIC LYSIS OF ADHESIONS  05/06/2012   Procedure: LAPAROSCOPIC LYSIS OF ADHESIONS;  Surgeon: Cheri Fowler, MD;  Location: [facility];  Service: Gynecology;  Laterality: N/A;   LAPAROSCOPIC NISSEN FUNDOPLICATION N/A 1/61/0960   Procedure: LAPAROSCOPIC hiatal hernia repair with fundoplication;  Surgeon: Kieth Brightly Arta Bruce, MD;  Location: [facility];  Service: General;  Laterality: N/A;   LAPAROSCOPY  05/06/2012   Procedure: LAPAROSCOPY OPERATIVE;  Surgeon: Cheri Fowler, MD;  Location: [facility];  Service: Gynecology;  Laterality: N/A;   SALPINGOOPHORECTOMY  05/06/2012   Procedure: SALPINGO OOPHORECTOMY;  Surgeon: Cheri Fowler, MD;  Location: [facility];  Service: Gynecology;  Laterality: Left;   Social History   Socioeconomic History   Marital status: Married    Spouse name: Not on file   Number of children: 3   Years of education: Not on file   Highest  education level: Not on file  Occupational History   Occupation: Medical illustrator  Tobacco Use   Smoking status: Never   Smokeless tobacco: Never  Vaping Use   Vaping Use: Never used  Substance and Sexual Activity   Alcohol use: No    Alcohol/week: 0.0 standard drinks of alcohol   Drug use: No   Sexual activity: Yes    Birth control/protection: Surgical, None    Comment: hysterectomy  Other Topics Concern   Not on file  Social History Narrative   Not on file   Social Determinants of Health   Financial Resource Strain: Not on file  Food Insecurity: Not on file  Transportation Needs: Not on file  Physical Activity: Not on file  Stress: Not on file  Social Connections: Not on file   Family History  Problem Relation Age of Onset   Hyperlipidemia Father    Hypertension Father    Hypertension Sister    Heart disease Paternal Grandfather    Breast cancer Neg Hx    Stomach cancer Neg Hx    Esophageal cancer Neg Hx    Colon cancer Neg Hx  Outpatient Encounter Medications as of 10/03/2021  Medication Sig   albuterol (VENTOLIN HFA) 108 (90 Base) MCG/ACT inhaler Inhale 1-2 puffs into the lungs every 6 (six) hours as needed for wheezing or shortness of breath.   Cholecalciferol (VITAMIN D3 PO) Take 1 tablet by mouth daily.   Continuous Blood Gluc Sensor (FREESTYLE LIBRE 3 SENSOR) MISC 1 Piece by Does not apply route every 14 (fourteen) days. Place 1 sensor on the skin every 14 days. Use to check glucose continuously   fluticasone (FLONASE) 50 MCG/ACT nasal spray Place 2 sprays into both nostrils daily as needed (allergies.).   ibuprofen (ADVIL,MOTRIN) 200 MG tablet Take 600 mg by mouth every 8 (eight) hours as needed (for pain.).   pantoprazole (PROTONIX) 40 MG tablet Take 1 tablet (40 mg total) by mouth daily.   [DISCONTINUED] Continuous Blood Gluc Sensor (FREESTYLE LIBRE 3 SENSOR) MISC 1 Piece by Does not apply route every 14 (fourteen) days. Place 1 sensor on the skin every 14  days. Use to check glucose continuously   No facility-administered encounter medications on file as of 10/03/2021.   ALLERGIES: Allergies  Allergen Reactions   Other Shortness Of Breath and Other (See Comments)    Selling, throat closes Iodine, betadine--due to shellfish allergy  Selling, throat closes   Penicillins Anaphylaxis, Swelling and Rash    Has patient had a PCN reaction causing immediate rash, facial/tongue/throat swelling, SOB or lightheadedness with hypotension: Yes Has patient had a PCN reaction causing severe rash involving mucus membranes or skin necrosis: Unknown Has patient had a PCN reaction that required hospitalization: No Has patient had a PCN reaction occurring within the last 10 years: Yes If all of the above answers are "NO", then may proceed with Cephalosporin use.    Shellfish Allergy Shortness Of Breath    Selling, throat closes   Iodinated Contrast Media Other (See Comments)    Iodine, betadine--due to shellfish allergy  Other reaction(s): Other (See Comments) Iodine, betadine--due to shellfish allergy  Other reaction(s): Other (See Comments) Iodine, betadine--due to shellfish allergy     Levaquin [Levofloxacin In D5w] Other (See Comments)    Patient cannot remember reaction.   Lodine [Etodolac] Palpitations    VACCINATION STATUS: Immunization History  Administered Date(s) Administered   Tdap 07/11/2014    HPI [name] is 51 y.o. female who presents today with a medical history as above. she is being seen in follow-up after she was seen  in consultation for hypoglycemia requested by Cory Munch, PA-C.  See notes from previous visit.   She was given a CGM which helped her manage and regulate her blood glucose profile.  She has a freestyle libre 3 which was downloaded and analyzed with her.  Shows that she has 98% time range, 2% level 1 hypoglycemia.  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 No further episode of severe hypoglycemia.   She has changed her diet based on whole food plant-based diet recommended during her last visit. Patient with past history of gestational diabetes with her child now 75 years old.  She has dealt with overweight/obesity most of her adult life.  She does not have diabetes currently.  She is not taking diabetes medication.    She denies any history of abdominal surgery nor bypass surgery.  She admits to dietary indiscretion including consumption of juices and sweetened beverages.  Review of Systems  Constitutional: no recent weight gain/loss, no fatigue, no subjective hyperthermia, no subjective hypothermia Eyes: no blurry vision, no xerophthalmia ENT:  no sore throat, no nodules palpated in throat, no dysphagia/odynophagia, no hoarseness Cardiovascular: no Chest Pain, no Shortness of Breath, no palpitations, no leg swelling Respiratory: no cough, no shortness of breath Gastrointestinal: no Nausea/Vomiting/Diarhhea Musculoskeletal: no muscle/joint aches Skin: no rashes Neurological: no tremors, no numbness, no tingling, no dizziness Psychiatric: no depression, no anxiety  Objective:       10/03/2021    3:54 PM 05/13/2021    3:02 PM 04/30/2021    2:41 PM  Vitals with BMI  Height 5' 3.5" 5' 3.5" 5' 3.5"  Weight 197 lbs 6 oz 191 lbs 13 oz 188 lbs 13 oz  BMI 34.42 40.97 35.32  Systolic 992 426 834  Diastolic 68 76 72  Pulse 92 72 80    BP 104/68   Pulse 92   Ht 5' 3.5" (1.613 m)   Wt 197 lb 6.4 oz (89.5 kg)   BMI 34.42 kg/m   Wt Readings from Last 3 Encounters:  10/03/21 197 lb 6.4 oz (89.5 kg)  05/13/21 191 lb 12.8 oz (87 kg)  04/30/21 188 lb 12.8 oz (85.6 kg)    Physical Exam  Constitutional:  Body mass index is 34.42 kg/m.,  not in acute distress,   CMP ( most recent) CMP     Component Value Date/Time   NA 140 08/30/2021 0924   K 4.3 08/30/2021 0924   CL 105 08/30/2021 0924   CO2 24 08/30/2021 0924   GLUCOSE 76 08/30/2021 0924   GLUCOSE 120 (H) 06/22/2020 0455   BUN  7 08/30/2021 0924   CREATININE 0.68 08/30/2021 0924   CALCIUM 9.2 08/30/2021 0924   PROT 6.6 08/30/2021 0924   ALBUMIN 4.0 08/30/2021 0924   AST 26 08/30/2021 0924   ALT 18 08/30/2021 0924   ALKPHOS 82 08/30/2021 0924   BILITOT 0.4 08/30/2021 0924   GFRNONAA >60 06/22/2020 0455   GFRAA >60 07/17/2017 0502     Diabetic Labs (most recent): Lab Results  Component Value Date   HGBA1C 5.2 10/03/2021   HGBA1C 4.8 05/30/2016   HGBA1C 4.5% 02/10/2013     Lipid Panel ( most recent) Lipid Panel     Component Value Date/Time   CHOL 178 08/30/2021 0924   TRIG 50 08/30/2021 0924   HDL 71 08/30/2021 0924   CHOLHDL 2.5 08/30/2021 0924   CHOLHDL 3.9 05/30/2016 0909   VLDL 10 05/30/2016 0909   LDLCALC 97 08/30/2021 0924   LABVLDL 10 08/30/2021 0924      Lab Results  Component Value Date   TSH 1.910 08/30/2021   TSH 1.968 05/30/2016   TSH 2.320 07/11/2014   FREET4 1.21 08/30/2021      Assessment & Plan:   Reactive hypoglycemia  I reviewed her AGP reports with the patient.  She presents with findings consistent with reactive hypoglycemia.  She presents with better consistency in her glycemic profile, no major hypoglycemia since last visit.  She is advised to continue to manage it with her CGM.    She will continue to benefit from lifestyle medicine. - she acknowledges that there is a room for improvement in her food and drink choices. - Suggestion is made for her to avoid simple carbohydrates  from her diet including Cakes, Sweet Desserts, Ice Cream, Soda (diet and regular), Sweet Tea, Candies, Chips, Cookies, Store Bought Juices, Alcohol , Artificial Sweeteners,  Coffee Creamer, and "Sugar-free" Products, Lemonade. This will help patient to have more stable blood glucose profile and potentially avoid unintended weight gain.  The  following Lifestyle Medicine recommendations according to [place] of Lifestyle Medicine  [facility]) were discussed and and offered to patient and  she  agrees to start the journey:  A. Whole Foods, Plant-Based Nutrition comprising of fruits and vegetables, plant-based proteins, whole-grain carbohydrates was discussed in detail with the patient.   A list for source of those nutrients were also provided to the patient.  Patient will use only water or unsweetened tea for hydration. B.  The need to stay away from risky substances including alcohol, smoking; obtaining 7 to 9 hours of restorative sleep, at least 150 minutes of moderate intensity exercise weekly, the importance of healthy social connections,  and stress management techniques were discussed. C.  A full color page of  Calorie density of various food groups per pound showing examples of each food groups was provided to the patient.   Increased intake of plant-based protein will stabilize her glycemic profile. -She does not have diabetes nor prediabetes at this time.  Her history of gestational diabetes puts her at risk of future type 2 diabetes.    I discussed and prescribed more sensors for her.   She does not need work-up for hypoglycemia at this time.   Her previsit CMP, a.m. cortisol, within normal limits.  Her cholesterol profile showed LDL of 97 above optimal.  Her thyroid function tests are consistent with euthyroid presentation. She will be considered for repeat lipid panel before her next visit.   - she is advised to maintain close follow up with Cory Munch, PA-C for primary care needs.   I spent 26 minutes in the care of the patient today including review of labs from Thyroid Function, CMP, and other relevant labs ; imaging/biopsy records (current and previous including abstractions from other facilities); face-to-face time discussing  her lab results and symptoms, medications doses, her options of short and long term treatment based on the latest standards of care / guidelines;   and documenting the encounter.  Emily Johns  participated in the discussions,  expressed understanding, and voiced agreement with the above plans.  All questions were answered to her satisfaction. she is encouraged to contact clinic should she have any questions or concerns prior to her return visit.    Follow up plan: Return in about 6 months (around 04/05/2022) for F/U with Pre-visit Labs, A1c -NV.   Glade Lloyd, MD [facility] [address] [place], [place] [address] Phone: [phone]  Fax: [phone]     10/03/2021, 4:28 PM  This note was partially dictated with voice recognition software. Similar sounding words can be transcribed inadequately or may not  be corrected upon review.

## 2021-11-15 ENCOUNTER — Telehealth: Payer: Self-pay | Admitting: Internal Medicine

## 2021-11-15 NOTE — Telephone Encounter (Signed)
Spoke with pt and she has tried some fiber/prune gummies but they have not helped. States it has been about 10 days since her last complete BM. Discussed with pt that she can try 7 doses of miralax with 32oz of liquid and drink 1 cup every 80mn until gone. Pt verbalized understanding and knows to call back if this does not help her.

## 2021-11-15 NOTE — Telephone Encounter (Signed)
Patient has been having issues with constipation.  She has tried mostly herbal remedies, gummies with prunes, etc. and they are not helping her very much.  She wanted to speak with someone to make sure that having prolonged constipation was not a sign of a bigger issue or if there was something else she should be taking to help move things along.  Please call patient and advise.  Thank you.

## 2021-12-09 DIAGNOSIS — M436 Torticollis: Secondary | ICD-10-CM | POA: Diagnosis not present

## 2021-12-09 DIAGNOSIS — Z6830 Body mass index (BMI) 30.0-30.9, adult: Secondary | ICD-10-CM | POA: Diagnosis not present

## 2021-12-09 DIAGNOSIS — J0100 Acute maxillary sinusitis, unspecified: Secondary | ICD-10-CM | POA: Diagnosis not present

## 2022-01-01 ENCOUNTER — Telehealth: Payer: Federal, State, Local not specified - PPO | Admitting: Nurse Practitioner

## 2022-01-01 DIAGNOSIS — T753XXA Motion sickness, initial encounter: Secondary | ICD-10-CM

## 2022-01-01 MED ORDER — MECLIZINE HCL 25 MG PO TABS: 25.0000 mg | ORAL_TABLET | Freq: Three times a day (TID) | ORAL | 0 refills | Status: DC | PRN

## 2022-01-01 NOTE — Progress Notes (Signed)
E Visit for Motion Sickness  We are sorry that you are not feeling well. Here is how we plan to help!  Based on what you have shared with me it looks like you have symptoms of motion sickness.  I have prescribed a medication that will help prevent or alleviate your symptoms:  Meclizine '25mg'$  by mouth three times per day as needed for nausea/motion sickness   Prevention:  You might feel better if you keep your eyes focused on outside while you are in motion. For example, if you are in a car, sit in the front and look in the direction you are moving; if you are on a boat, stay on the deck and look to the horizon. This helps make what you see match the movement you are feeling, and so you are less likely to feel sick.  You should also avoid reading, watching a movie, texting or reading messages, or looking at things close to you inside the vehicle you are riding in.  Use the seat head rest. Lean your head against the back of the seat or head rest when traveling in vehicles with seats to minimize head movements.  On a ship: When making your reservations, choose a cabin in the middle of the ship and near the waterline. When on board, go up on deck and focus on the horizon.  In an airplane: Request a window seat and look out the window. A seat over the front edge of the wing is the most preferable spot (the degree of motion is the lowest here). Direct the air vent to blow cool air on your face.  On a train: Always face forward and sit near a window.  In a vehicle: Sit in the front seat; if you are the passenger, look at the scenery in the distance. For some people, driving the vehicle (rather than being a passenger) is an instant remedy.  Avoid others who have become nauseous with motion sickness. Seeing and smelling others who have motion sickness may cause you to become sick.  GET HELP RIGHT AWAY IF:  Your symptoms do not improve or worsen within 2 days after treatment.  You cannot keep  down fluids after trying the medication.  Other associated symptoms such as severe headache, visual field changes, fever, or intractable nausea and vomiting.  MAKE SURE YOU:  Understand these instructions. Will watch your condition. Will get help right away if you are not doing well or get worse.  Thank you for choosing an e-visit.  Your e-visit answers were reviewed by a board certified advanced clinical practitioner to complete your personal care plan. Depending upon the condition, your plan could have included both over the counter or prescription medications.  Please review your pharmacy choice. Be sure that the pharmacy you have chosen is open so that you can pick up your prescription now.  If there is a problem you may message your provider in [place] to have the prescription routed to another pharmacy.  Your safety is important to [place]. If you have drug allergies check your prescription carefully.   For the next 24 hours, you can use MyChart to ask questions about today's visit, request a non-urgent call back, or ask for a work or school excuse from your e-visit provider.  You will get an e-mail in the next two days asking about your experience. I hope that your e-visit has been valuable and will speed your recovery.   References or for more information: ThenWeb.com.ee https://my.ResearchRoots.be https://www.uptodate.com  Mary-Margaret Hassell Done, FNP   5-10 minutes spent reviewing and documenting in chart.

## 2022-01-23 DIAGNOSIS — K080 Exfoliation of teeth due to systemic causes: Secondary | ICD-10-CM | POA: Diagnosis not present

## 2022-03-04 DIAGNOSIS — D225 Melanocytic nevi of trunk: Secondary | ICD-10-CM | POA: Diagnosis not present

## 2022-03-04 DIAGNOSIS — Z1283 Encounter for screening for malignant neoplasm of skin: Secondary | ICD-10-CM | POA: Diagnosis not present

## 2022-03-04 DIAGNOSIS — D485 Neoplasm of uncertain behavior of skin: Secondary | ICD-10-CM | POA: Diagnosis not present

## 2022-04-04 ENCOUNTER — Other Ambulatory Visit: Payer: Self-pay | Admitting: "Endocrinology

## 2022-04-17 ENCOUNTER — Ambulatory Visit: Payer: Federal, State, Local not specified - PPO | Admitting: "Endocrinology

## 2022-04-17 ENCOUNTER — Encounter: Payer: Self-pay | Admitting: "Endocrinology

## 2022-04-17 VITALS — BP 116/78 | HR 72 | Ht 63.5 in | Wt 204.2 lb

## 2022-04-17 DIAGNOSIS — E161 Other hypoglycemia: Secondary | ICD-10-CM

## 2022-04-17 DIAGNOSIS — E782 Mixed hyperlipidemia: Secondary | ICD-10-CM

## 2022-04-17 MED ORDER — FREESTYLE LIBRE 3 SENSOR MISC: 1 refills | Status: AC

## 2022-04-17 NOTE — Progress Notes (Signed)
04/17/2022, 5:42 PM   Endocrinology follow-up note  Subjective:    Patient ID: Emily Johns, female    DOB: 11-24-1970, PCP Cory Munch, PA-C   Past Medical History:  Diagnosis Date   Arthritis    back    Asthma    GERD (gastroesophageal reflux disease)    Hiatal hernia    Hyperlipemia    patient denies   PONV (postoperative nausea and vomiting)    Schatzki's ring    Vitamin D deficiency    Past Surgical History:  Procedure Laterality Date   ABDOMINAL HYSTERECTOMY     partial   APPENDECTOMY     CESAREAN SECTION     x3   CHOLECYSTECTOMY     COLONOSCOPY     EXPLORATORY LAPAROTOMY     GASTROSTOMY N/A 06/21/2020   Procedure: GTUBE INSERTION,UPPER ENDOSCOPY ;  Surgeon: Mickeal Skinner, MD;  Location: [facility];  Service: General;  Laterality: N/A;   HERNIA REPAIR  2019   HIATAL HERNIA REPAIR N/A 06/21/2020   Procedure: LAPAROSCOPIC REVISIONAL HIATAL HERNIA REPAIR WITH MESH;  Surgeon: Kinsinger, Arta Bruce, MD;  Location: [facility];  Service: General;  Laterality: N/A;   LAPAROSCOPIC LYSIS OF ADHESIONS  05/06/2012   Procedure: LAPAROSCOPIC LYSIS OF ADHESIONS;  Surgeon: Cheri Fowler, MD;  Location: [facility];  Service: Gynecology;  Laterality: N/A;   LAPAROSCOPIC NISSEN FUNDOPLICATION N/A 6/65/9935   Procedure: LAPAROSCOPIC hiatal hernia repair with fundoplication;  Surgeon: Kieth Brightly Arta Bruce, MD;  Location: [facility];  Service: General;  Laterality: N/A;   LAPAROSCOPY  05/06/2012   Procedure: LAPAROSCOPY OPERATIVE;  Surgeon: Cheri Fowler, MD;  Location: [facility];  Service: Gynecology;  Laterality: N/A;   SALPINGOOPHORECTOMY  05/06/2012   Procedure: SALPINGO OOPHORECTOMY;  Surgeon: Cheri Fowler, MD;  Location: [facility];  Service: Gynecology;  Laterality: Left;   Social History   Socioeconomic History   Marital status: Married    Spouse name: Not on file   Number of children: 3   Years of education: Not on file    Highest education level: Not on file  Occupational History   Occupation: Medical illustrator  Tobacco Use   Smoking status: Never   Smokeless tobacco: Never  Vaping Use   Vaping Use: Never used  Substance and Sexual Activity   Alcohol use: No    Alcohol/week: 0.0 standard drinks of alcohol   Drug use: No   Sexual activity: Yes    Birth control/protection: Surgical, None    Comment: hysterectomy  Other Topics Concern   Not on file  Social History Narrative   Not on file   Social Determinants of Health   Financial Resource Strain: Not on file  Food Insecurity: Not on file  Transportation Needs: Not on file  Physical Activity: Not on file  Stress: Not on file  Social Connections: Not on file   Family History  Problem Relation Age of Onset   Hyperlipidemia Father    Hypertension Father    Hypertension Sister    Heart disease Paternal Grandfather    Breast cancer Neg Hx    Stomach cancer Neg Hx    Esophageal cancer Neg Hx    Colon cancer Neg Hx  Outpatient Encounter Medications as of 04/17/2022  Medication Sig   albuterol (VENTOLIN HFA) 108 (90 Base) MCG/ACT inhaler Inhale 1-2 puffs into the lungs every 6 (six) hours as needed for wheezing or shortness of breath.   Cholecalciferol (VITAMIN D3 PO) Take 1 tablet by mouth daily.   Continuous Blood Gluc Sensor (FREESTYLE LIBRE 3 SENSOR) MISC PLACE 1 SENSOR ON SKIN EVERY 14 DAYS TO CHECK GLUCOSE CONTINUOUSLY   fluticasone (FLONASE) 50 MCG/ACT nasal spray Place 2 sprays into both nostrils daily as needed (allergies.).   ibuprofen (ADVIL,MOTRIN) 200 MG tablet Take 600 mg by mouth every 8 (eight) hours as needed (for pain.).   pantoprazole (PROTONIX) 40 MG tablet Take 1 tablet (40 mg total) by mouth daily. (Patient not taking: Reported on 04/17/2022)   [DISCONTINUED] Continuous Blood Gluc Sensor (FREESTYLE LIBRE 3 SENSOR) MISC PLACE 1 SENSOR ON SKIN EVERY 14 DAYS TO CHECK GLUCOSE CONTINUOUSLY   [DISCONTINUED] meclizine (ANTIVERT) 25  MG tablet Take 1 tablet (25 mg total) by mouth 3 (three) times daily as needed for dizziness.   No facility-administered encounter medications on file as of 04/17/2022.   ALLERGIES: Allergies  Allergen Reactions   Other Shortness Of Breath and Other (See Comments)    Selling, throat closes Iodine, betadine--due to shellfish allergy  Selling, throat closes   Penicillins Anaphylaxis, Swelling and Rash    Has patient had a PCN reaction causing immediate rash, facial/tongue/throat swelling, SOB or lightheadedness with hypotension: Yes Has patient had a PCN reaction causing severe rash involving mucus membranes or skin necrosis: Unknown Has patient had a PCN reaction that required hospitalization: No Has patient had a PCN reaction occurring within the last 10 years: Yes If all of the above answers are "NO", then may proceed with Cephalosporin use.    Shellfish Allergy Shortness Of Breath    Selling, throat closes   Iodinated Contrast Media Other (See Comments)    Iodine, betadine--due to shellfish allergy  Other reaction(s): Other (See Comments) Iodine, betadine--due to shellfish allergy  Other reaction(s): Other (See Comments) Iodine, betadine--due to shellfish allergy     Levaquin [Levofloxacin In D5w] Other (See Comments)    Patient cannot remember reaction.   Lodine [Etodolac] Palpitations    VACCINATION STATUS: Immunization History  Administered Date(s) Administered   Tdap 07/11/2014    HPI Emily Johns is 52 y.o. female who presents today with a medical history as above. she is being seen in follow-up after she was seen  in consultation for hypoglycemia requested by Cory Munch, PA-C.  See notes from previous visit.   She was given a CGM which helped her manage and regulate her blood glucose profile.  She has a freestyle libre 3 which was downloaded and analyzed with her.  Shows that she has 98% time range, 2% level 1 hypoglycemia.  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 No further episode of severe hypoglycemia.  She has changed her diet based on whole food plant-based diet recommended during her last visit. Patient with past history of gestational diabetes with her child now 74 years old.  She has dealt with overweight/obesity most of her adult life.  She does not have diabetes currently.  She is not taking diabetes medication.   Her point-of-care A1c today is 4.9%.  She denies any history of abdominal surgery nor bypass surgery.    Review of Systems  Constitutional: no recent weight gain/loss, no fatigue, no subjective hyperthermia, no subjective hypothermia Eyes: no blurry vision, no xerophthalmia ENT: no  sore throat, no nodules palpated in throat, no dysphagia/odynophagia, no hoarseness   Objective:       04/17/2022    4:07 PM 10/03/2021    3:54 PM 05/13/2021    3:02 PM  Vitals with BMI  Height 5' 3.5" 5' 3.5" 5' 3.5"  Weight 204 lbs 3 oz 197 lbs 6 oz 191 lbs 13 oz  BMI 35.6 16.10 96.04  Systolic 540 981 191  Diastolic 78 68 76  Pulse 72 92 72    BP 116/78   Pulse 72   Ht 5' 3.5" (1.613 m)   Wt 204 lb 3.2 oz (92.6 kg)   BMI 35.61 kg/m   Wt Readings from Last 3 Encounters:  04/17/22 204 lb 3.2 oz (92.6 kg)  10/03/21 197 lb 6.4 oz (89.5 kg)  05/13/21 191 lb 12.8 oz (87 kg)    Physical Exam  Constitutional:  Body mass index is 35.61 kg/m.,  not in acute distress,   CMP ( most recent) CMP     Component Value Date/Time   NA 140 08/30/2021 0924   K 4.3 08/30/2021 0924   CL 105 08/30/2021 0924   CO2 24 08/30/2021 0924   GLUCOSE 76 08/30/2021 0924   GLUCOSE 120 (H) 06/22/2020 0455   BUN 7 08/30/2021 0924   CREATININE 0.68 08/30/2021 0924   CALCIUM 9.2 08/30/2021 0924   PROT 6.6 08/30/2021 0924   ALBUMIN 4.0 08/30/2021 0924   AST 26 08/30/2021 0924   ALT 18 08/30/2021 0924   ALKPHOS 82 08/30/2021 0924   BILITOT 0.4 08/30/2021 0924   GFRNONAA >60 06/22/2020 0455   GFRAA >60 07/17/2017 0502     Diabetic Labs  (most recent): Lab Results  Component Value Date   HGBA1C 5.2 10/03/2021   HGBA1C 4.8 05/30/2016   HGBA1C 4.5% 02/10/2013     Lipid Panel ( most recent) Lipid Panel     Component Value Date/Time   CHOL 178 08/30/2021 0924   TRIG 50 08/30/2021 0924   HDL 71 08/30/2021 0924   CHOLHDL 2.5 08/30/2021 0924   CHOLHDL 3.9 05/30/2016 0909   VLDL 10 05/30/2016 0909   LDLCALC 97 08/30/2021 0924   LABVLDL 10 08/30/2021 0924      Lab Results  Component Value Date   TSH 1.910 08/30/2021   TSH 1.968 05/30/2016   TSH 2.320 07/11/2014   FREET4 1.21 08/30/2021      Assessment & Plan:   Reactive hypoglycemia  I reviewed her AGP reports with the patient.  She presents with findings consistent with well-controlled/manage reactive hypoglycemia.  No further major hypoglycemia.  She continues to benefit from her CGM, advised to continue.    She will continue to benefit from lifestyle medicine. - she acknowledges that there is a room for improvement in her food and drink choices. - Suggestion is made for her to avoid simple carbohydrates  from her diet including Cakes, Sweet Desserts, Ice Cream, Soda (diet and regular), Sweet Tea, Candies, Chips, Cookies, Store Bought Juices, Alcohol , Artificial Sweeteners,  Coffee Creamer, and "Sugar-free" Products, Lemonade. This will help patient to have more stable blood glucose profile and potentially avoid unintended weight gain.  The following Lifestyle Medicine recommendations according to [facility]) were discussed and and offered to patient and she  agrees to start the journey:  A. Whole Foods, Plant-Based Nutrition comprising of fruits and vegetables, plant-based proteins, whole-grain carbohydrates was discussed in detail with the patient.   A list  for source of those nutrients were also provided to the patient.  Patient will use only water or unsweetened tea for hydration. B.  The need to stay away from risky  substances including alcohol, smoking; obtaining 7 to 9 hours of restorative sleep, at least 150 minutes of moderate intensity exercise weekly, the importance of healthy social connections,  and stress management techniques were discussed. C.  A full color page of  Calorie density of various food groups per pound showing examples of each food groups was provided to the patient.   Increased intake of plant-based protein will stabilize her glycemic profile. -She does not have diabetes nor prediabetes at this time.  Her point-of-care A1c is 4.9% today.  Her history of gestational diabetes puts her at risk of future type 2 diabetes.    I discussed and prescribed more sensors for her.   She does not need work-up for hypoglycemia at this time.   Her previsit CMP, a.m. cortisol, within normal limits.  Her cholesterol profile showed LDL of 97 above optimal.  Her thyroid function tests are consistent with euthyroid presentation.  She will be considered for repeat lipid panel before her next visit.   - she is advised to maintain close follow up with Cory Munch, PA-C for primary care needs.   I spent 21 minutes in the care of the patient today including review of labs from Thyroid Function, CMP, and other relevant labs ; imaging/biopsy records (current and previous including abstractions from other facilities); face-to-face time discussing  her lab results and symptoms, medications doses, her options of short and long term treatment based on the latest standards of care / guidelines;   and documenting the encounter.  Emily Johns  participated in the discussions, expressed understanding, and voiced agreement with the above plans.  All questions were answered to her satisfaction. she is encouraged to contact clinic should she have any questions or concerns prior to her return visit.    Follow up plan: Return in about 6 months (around 10/16/2022) for F/U with Pre-visit Labs, Meter/CGM/Logs, A1c  here.   Glade Lloyd, MD [facility] [address] [place], [place] [address] Phone: [phone]  Fax: [phone]     04/17/2022, 5:42 PM  This note was partially dictated with voice recognition software. Similar sounding words can be transcribed inadequately or may not  be corrected upon review.

## 2022-06-18 DIAGNOSIS — Z1231 Encounter for screening mammogram for malignant neoplasm of breast: Secondary | ICD-10-CM | POA: Diagnosis not present

## 2022-06-20 DIAGNOSIS — N6002 Solitary cyst of left breast: Secondary | ICD-10-CM | POA: Diagnosis not present

## 2022-06-20 DIAGNOSIS — R922 Inconclusive mammogram: Secondary | ICD-10-CM | POA: Diagnosis not present

## 2022-06-20 DIAGNOSIS — R928 Other abnormal and inconclusive findings on diagnostic imaging of breast: Secondary | ICD-10-CM | POA: Diagnosis not present

## 2022-07-01 DIAGNOSIS — Z130 Encounter for screening for diseases of the blood and blood-forming organs and certain disorders involving the immune mechanism: Secondary | ICD-10-CM | POA: Diagnosis not present

## 2022-07-01 DIAGNOSIS — Z6833 Body mass index (BMI) 33.0-33.9, adult: Secondary | ICD-10-CM | POA: Diagnosis not present

## 2022-07-01 DIAGNOSIS — Z01419 Encounter for gynecological examination (general) (routine) without abnormal findings: Secondary | ICD-10-CM | POA: Diagnosis not present

## 2022-07-01 DIAGNOSIS — Z1389 Encounter for screening for other disorder: Secondary | ICD-10-CM | POA: Diagnosis not present

## 2022-08-04 IMAGING — CT CT RENAL STONE PROTOCOL
2 of 3 series · 16 of 46 positions shown, 18 images · non-contrast
Comparison: CT the abdomen and pelvis 11/14/2010.

CLINICAL DATA: 49-year-old female with history of left-sided flank
pain for the past 2-3 months.

EXAM:
CT ABDOMEN AND PELVIS WITHOUT CONTRAST
TECHNIQUE: Multidetector CT imaging of the abdomen and pelvis was performed
following the standard protocol without IV contrast.

[Series 4: lung bases · axial · 0.87mm/px · z∈[+1432,+1574]mm · 13 of 83 slices shown, 15 images]
[im 6/83  soft-tissue]
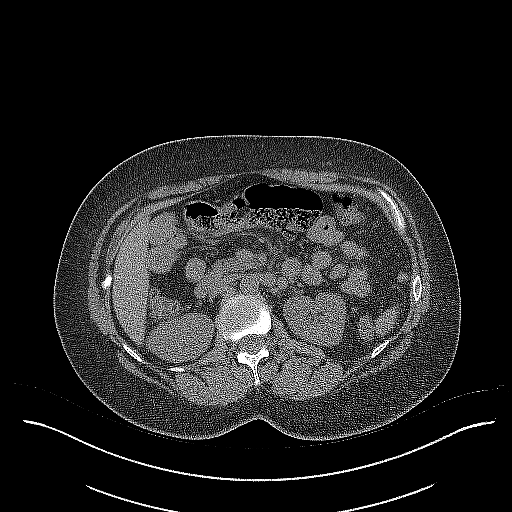
[im 6/83  bone]
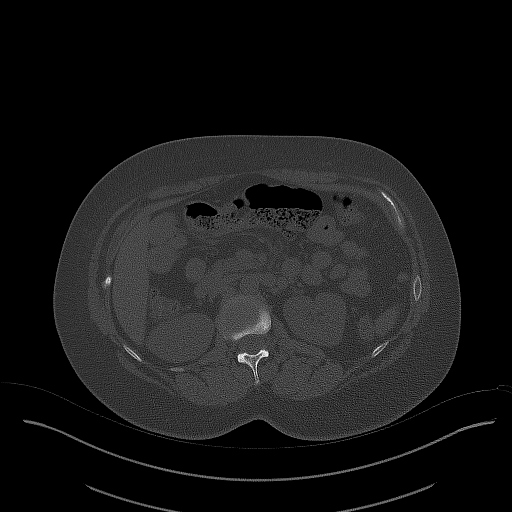
[im 11/83  soft-tissue]
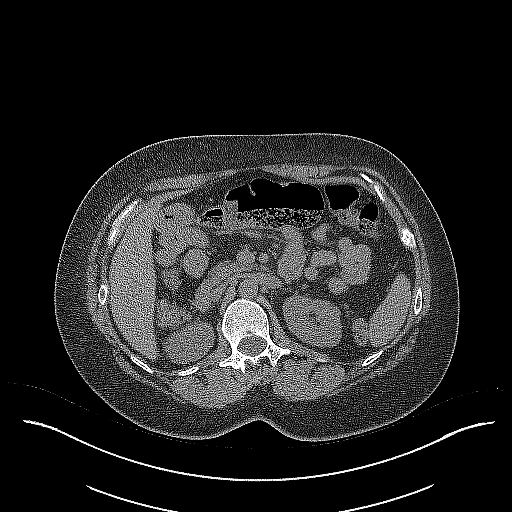
[im 16/83  soft-tissue]
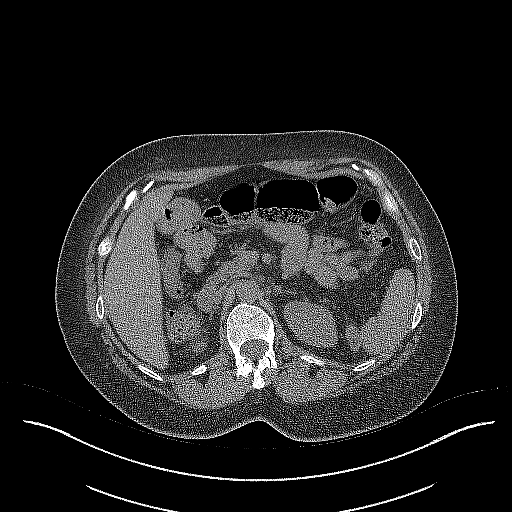
[im 24/83  soft-tissue]
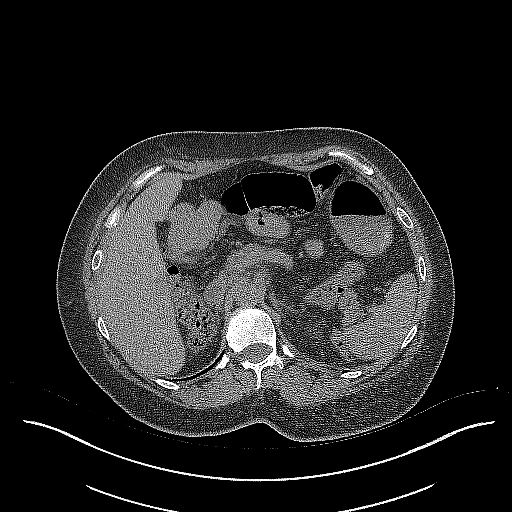
[im 30/83  soft-tissue]
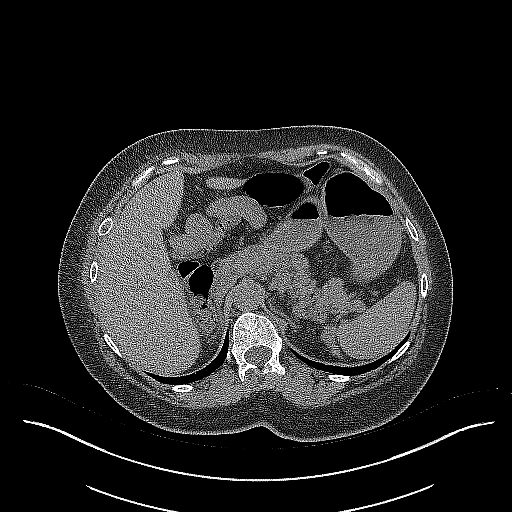
[im 35/83  soft-tissue]
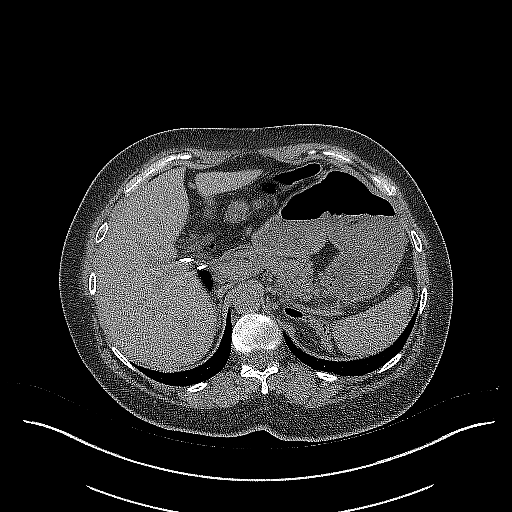
[im 43/83  soft-tissue]
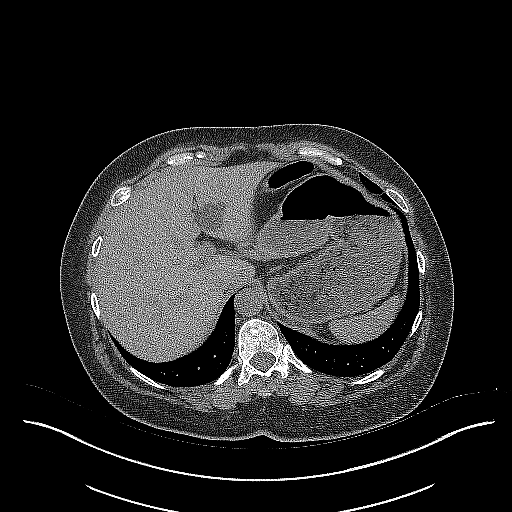
[im 48/83  soft-tissue]
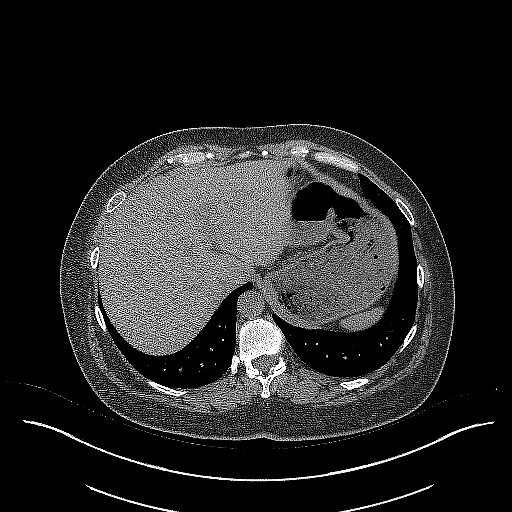
[im 53/83  soft-tissue]
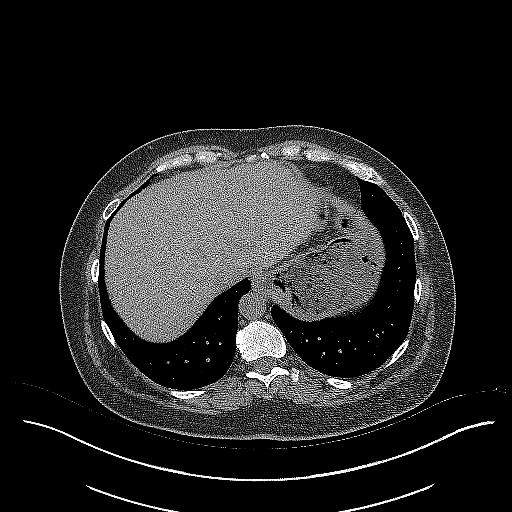
[im 53/83  bone]
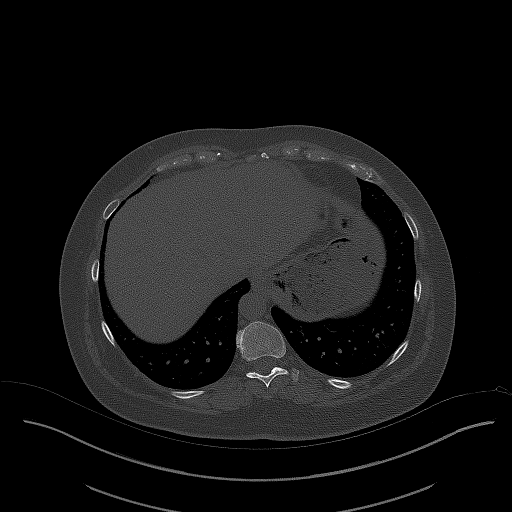
[im 59/83  soft-tissue]
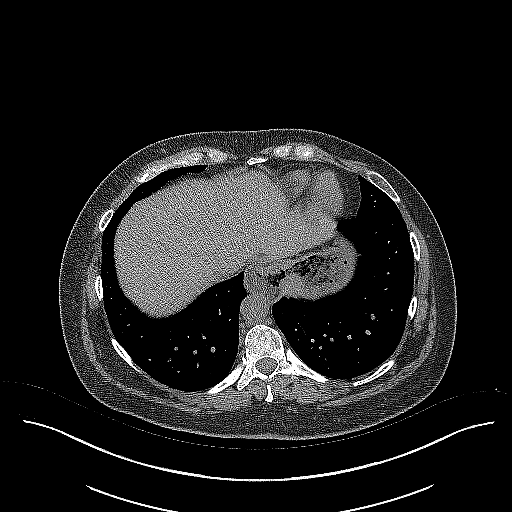
[im 67/83  soft-tissue]
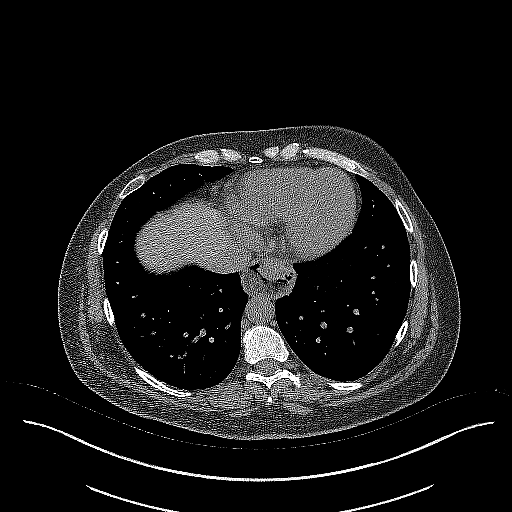
[im 72/83  soft-tissue]
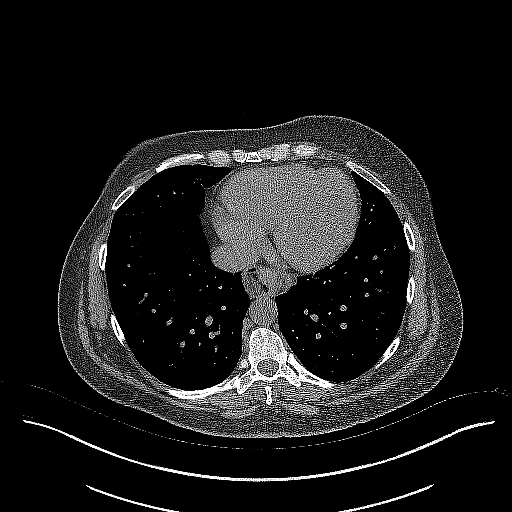
[im 77/83  soft-tissue]
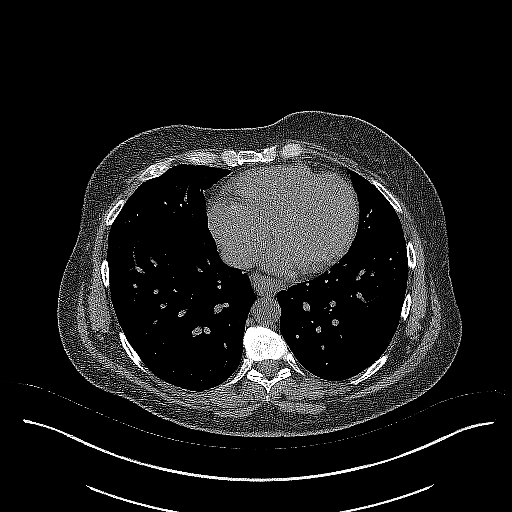

[Series 5: coronal · coronal · 0.75mm/px · 3 of 151 slices shown]
[im 51/151  soft-tissue]
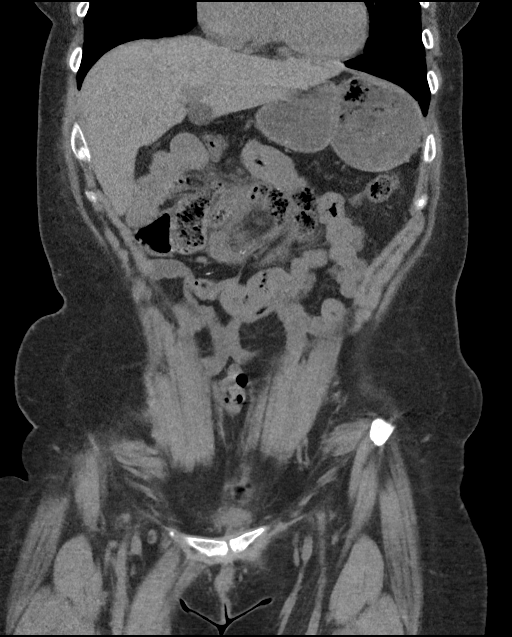
[im 67/151  soft-tissue]
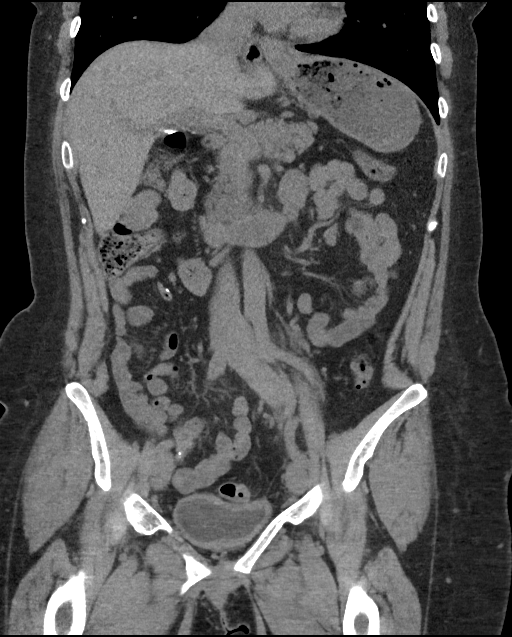
[im 84/151  soft-tissue]
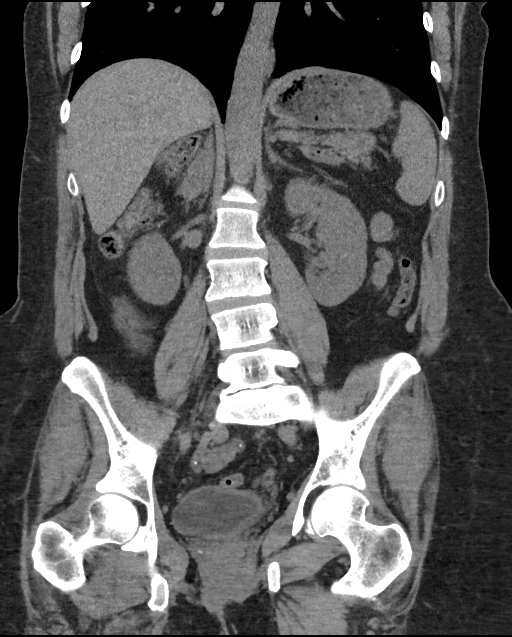

[16 of 46 positions shown; findings below may reference images not displayed]

FINDINGS: Lower chest: Moderate-sized hiatal hernia.

Hepatobiliary: 2.8 x 2.4 cm low-attenuation lesion in the central
aspect of segment 3 of the liver, incompletely characterized on
today's non-contrast CT examination, but favored to represent a
cyst. Status post cholecystectomy.

Pancreas: No definite pancreatic mass or peripancreatic fluid
collections or inflammatory changes are noted on today's noncontrast
CT examination.

Spleen: Unremarkable.

Adrenals/Urinary Tract: There are no abnormal calcifications within
the collecting system of either kidney, along the course of either
ureter, or within the lumen of the urinary bladder. No
hydroureteronephrosis or perinephric stranding to suggest urinary
tract obstruction at this time. The unenhanced appearance of the
kidneys is unremarkable bilaterally. Unenhanced appearance of the
urinary bladder is normal. Bilateral adrenal glands are normal in
appearance.

Stomach/Bowel: Unenhanced appearance of the stomach is normal. No
pathologic dilatation of small bowel or colon. The appendix is not
confidently identified and may be surgically absent. Regardless,
there are no inflammatory changes noted adjacent to the cecum to
suggest the presence of an acute appendicitis at this time.

Vascular/Lymphatic: Aortic atherosclerosis. No lymphadenopathy noted
in the abdomen or pelvis.

Reproductive: Status post hysterectomy. Ovaries are not confidently
identified may be surgically absent or atrophic.

Other: Tiny umbilical hernia containing only omental fat. No
significant volume of ascites. No pneumoperitoneum.

Musculoskeletal: There are no aggressive appearing lytic or blastic
lesions noted in the visualized portions of the skeleton.
IMPRESSION: 1. No acute findings are noted in the abdomen or pelvis to account
for the patient's symptoms. Specifically, no urinary tract calculi
no findings of urinary tract obstruction are noted at this time.
2. Tiny umbilical hernia containing only omental fat. No associated
bowel incarceration or obstruction at this time.
3. Aortic atherosclerosis.
4. Moderate-sized hiatal hernia.
5. Additional incidental findings, as above

## 2022-08-28 DIAGNOSIS — I7300 Raynaud's syndrome without gangrene: Secondary | ICD-10-CM | POA: Diagnosis not present

## 2022-09-02 ENCOUNTER — Other Ambulatory Visit: Payer: Self-pay | Admitting: *Deleted

## 2022-09-02 DIAGNOSIS — M79676 Pain in unspecified toe(s): Secondary | ICD-10-CM

## 2022-09-09 ENCOUNTER — Ambulatory Visit (HOSPITAL_COMMUNITY)
Admission: RE | Admit: 2022-09-09 | Discharge: 2022-09-09 | Disposition: A | Discharge status: Home / Self Care | Payer: Federal, State, Local not specified - PPO | Source: Ambulatory Visit | Attending: Vascular Surgery | Admitting: Vascular Surgery

## 2022-09-09 DIAGNOSIS — M79676 Pain in unspecified toe(s): Secondary | ICD-10-CM | POA: Insufficient documentation

## 2022-09-09 LAB — VAS US ABI WITH/WO TBI
Left ABI: 1.11
Right ABI: 1.12

## 2022-09-23 ENCOUNTER — Encounter: Payer: Self-pay | Admitting: Vascular Surgery

## 2022-09-23 ENCOUNTER — Ambulatory Visit (INDEPENDENT_AMBULATORY_CARE_PROVIDER_SITE_OTHER): Payer: Federal, State, Local not specified - PPO | Admitting: Vascular Surgery

## 2022-09-23 VITALS — BP 116/79 | HR 78 | Temp 98.1°F | Resp 20 | Ht 63.5 in | Wt 200.0 lb

## 2022-09-23 DIAGNOSIS — R230 Cyanosis: Secondary | ICD-10-CM

## 2022-09-23 NOTE — Progress Notes (Unsigned)
[facility] [place]  ASSESSMENT / PLAN: 52 y.o. female with bluish discoloration of her toes without clear cause. She does not describe the common inciting factors typical of Raynaud's phenomenon. I do not think this is being caused by reactive hypoglycemia, but have limited experience with this disease. Her vascular exam is normal. Her capillary refill is normal. Her non-invasive testing is reassuring. She can follow up with me on an as needed basis.   CHIEF COMPLAINT: blue toes  HISTORY OF PRESENT ILLNESS: Emily Johns is a 52 y.o. female who presents to clinic for evaluation of bluish discoloration of her toes.  No cause has been found for this as of yet.  She does not have the typical constellation of Raynaud's phenomenon.  She reports the bluish color is not associated with any discomfort.  She does not notice bluish or white discoloration with cold exposure or stress.  She carries a diagnosis of reactive hypoglycemia, where her blood sugar drops several hours after a meal without a diagnosis of diabetes.  Her podiatrist was wondering if this could cause vasospasm and referred her for evaluation to my office.  Past Medical History:  Diagnosis Date   Arthritis    back    Asthma    GERD (gastroesophageal reflux disease)    Hiatal hernia    Hyperlipemia    patient denies   PONV (postoperative nausea and vomiting)    Schatzki's ring    Vitamin D deficiency     Past Surgical History:  Procedure Laterality Date   ABDOMINAL HYSTERECTOMY     partial   APPENDECTOMY     CESAREAN SECTION     x3   CHOLECYSTECTOMY     COLONOSCOPY     EXPLORATORY LAPAROTOMY     GASTROSTOMY N/A 06/21/2020   Procedure: GTUBE INSERTION,UPPER ENDOSCOPY ;  Surgeon: Rodman Pickle, MD;  Location: [facility];  Service: General;  Laterality: N/A;   HERNIA REPAIR  2019   HIATAL HERNIA REPAIR N/A 06/21/2020   Procedure: LAPAROSCOPIC REVISIONAL HIATAL HERNIA REPAIR WITH MESH;  Surgeon:  Kinsinger, De Blanch, MD;  Location: [facility];  Service: General;  Laterality: N/A;   LAPAROSCOPIC LYSIS OF ADHESIONS  05/06/2012   Procedure: LAPAROSCOPIC LYSIS OF ADHESIONS;  Surgeon: Lavina Hamman, MD;  Location: [facility];  Service: Gynecology;  Laterality: N/A;   LAPAROSCOPIC NISSEN FUNDOPLICATION N/A 07/16/2017   Procedure: LAPAROSCOPIC hiatal hernia repair with fundoplication;  Surgeon: Sheliah Hatch De Blanch, MD;  Location: [facility];  Service: General;  Laterality: N/A;   LAPAROSCOPY  05/06/2012   Procedure: LAPAROSCOPY OPERATIVE;  Surgeon: Lavina Hamman, MD;  Location: [facility];  Service: Gynecology;  Laterality: N/A;   SALPINGOOPHORECTOMY  05/06/2012   Procedure: SALPINGO OOPHORECTOMY;  Surgeon: Lavina Hamman, MD;  Location: [facility];  Service: Gynecology;  Laterality: Left;    Family History  Problem Relation Age of Onset   Hyperlipidemia Father    Hypertension Father    Hypertension Sister    Heart disease Paternal Grandfather    Breast cancer Neg Hx    Stomach cancer Neg Hx    Esophageal cancer Neg Hx    Colon cancer Neg Hx     Social History   Socioeconomic History   Marital status: Married    Spouse name: Not on file   Number of children: 3   Years of education: Not on file   Highest education level: Not on file  Occupational History   Occupation: Advertising account planner  Tobacco  Use   Smoking status: Never   Smokeless tobacco: Never  Vaping Use   Vaping Use: Never used  Substance and Sexual Activity   Alcohol use: No    Alcohol/week: 0.0 standard drinks of alcohol   Drug use: No   Sexual activity: Yes    Birth control/protection: Surgical, None    Comment: hysterectomy  Other Topics Concern   Not on file  Social History Narrative   Not on file   Social Determinants of Health   Financial Resource Strain: Not on file  Food Insecurity: Not on file  Transportation Needs: Not on file  Physical Activity: Not on file  Stress: Not on file  Social Connections: Not on file   Intimate Partner Violence: Not on file    Allergies  Allergen Reactions   Other Shortness Of Breath and Other (See Comments)    Selling, throat closes Iodine, betadine--due to shellfish allergy  Selling, throat closes   Penicillins Anaphylaxis, Swelling and Rash    Has patient had a PCN reaction causing immediate rash, facial/tongue/throat swelling, SOB or lightheadedness with hypotension: Yes Has patient had a PCN reaction causing severe rash involving mucus membranes or skin necrosis: Unknown Has patient had a PCN reaction that required hospitalization: No Has patient had a PCN reaction occurring within the last 10 years: Yes If all of the above answers are "NO", then may proceed with Cephalosporin use.    Shellfish Allergy Shortness Of Breath    Selling, throat closes   Iodinated Contrast Media Other (See Comments)    Iodine, betadine--due to shellfish allergy  Other reaction(s): Other (See Comments) Iodine, betadine--due to shellfish allergy  Other reaction(s): Other (See Comments) Iodine, betadine--due to shellfish allergy     Levaquin [Levofloxacin In D5w] Other (See Comments)    Patient cannot remember reaction.   Lodine [Etodolac] Palpitations    Current Outpatient Medications  Medication Sig Dispense Refill   albuterol (VENTOLIN HFA) 108 (90 Base) MCG/ACT inhaler Inhale 1-2 puffs into the lungs every 6 (six) hours as needed for wheezing or shortness of breath.     Cholecalciferol (VITAMIN D3 PO) Take 1 tablet by mouth daily.     Continuous Blood Gluc Sensor (FREESTYLE LIBRE 3 SENSOR) MISC PLACE 1 SENSOR ON SKIN EVERY 14 DAYS TO CHECK GLUCOSE CONTINUOUSLY 6 each 1   fluticasone (FLONASE) 50 MCG/ACT nasal spray Place 2 sprays into both nostrils daily as needed (allergies.).     ibuprofen (ADVIL,MOTRIN) 200 MG tablet Take 600 mg by mouth every 8 (eight) hours as needed (for pain.).     pantoprazole (PROTONIX) 40 MG tablet Take 1 tablet (40 mg total) by mouth daily. 30  tablet 2   No current facility-administered medications for this visit.    PHYSICAL EXAM Vitals:   09/23/22 1441  BP: 116/79  Pulse: 78  Resp: 20  Temp: 98.1 F (36.7 C)  SpO2: 99%  Weight: 200 lb (90.7 kg)  Height: 5' 3.5" (1.613 m)   Well-appearing middle-aged woman in no acute distress Regular rate and rhythm  unlabored breathing 2+ dorsalis pedis pulses bilaterally Mild cyanosis about all toes Brisk capillary refill (less than 2 seconds) throughout the toes  PERTINENT LABORATORY AND RADIOLOGIC DATA  Most recent CBC    Latest Ref Rng & Units 06/22/2020    4:55 AM 06/21/2020    3:10 PM 06/12/2020   10:33 AM  CBC  WBC 4.0 - 10.5 K/uL 8.3  8.4  3.2   Hemoglobin 12.0 - 15.0 g/dL  10.1  10.6  11.2   Hematocrit 36.0 - 46.0 % 31.9  33.3  35.9   Platelets 150 - 400 K/uL 239  247  246      Most recent CMP    Latest Ref Rng & Units 08/30/2021    9:24 AM 06/22/2020    4:55 AM 06/21/2020    3:10 PM  CMP  Glucose 70 - 99 mg/dL 76  564    BUN 6 - 24 mg/dL 7  11    Creatinine 3.32 - 1.00 mg/dL 9.51  8.84  1.66   Sodium 134 - 144 mmol/L 140  138    Potassium 3.5 - 5.2 mmol/L 4.3  4.3    Chloride 96 - 106 mmol/L 105  111    CO2 20 - 29 mmol/L 24  22    Calcium 8.7 - 10.2 mg/dL 9.2  8.5    Total Protein 6.0 - 8.5 g/dL 6.6     Total Bilirubin 0.0 - 1.2 mg/dL 0.4     Alkaline Phos 44 - 121 IU/L 82     AST 0 - 40 IU/L 26     ALT 0 - 32 IU/L 18       Renal function CrCl cannot be calculated (Patient's most recent lab result is older than the maximum 21 days allowed.).  HbA1c, POC (controlled diabetic range) (%)  Date Value  10/03/2021 5.2    LDL Chol Calc (NIH)  Date Value Ref Range Status  08/30/2021 97 0 - 99 mg/dL Final     +-------+-----------+-----------+------------+------------+  ABI/TBIToday's ABIToday's TBIPrevious ABIPrevious TBI  +-------+-----------+-----------+------------+------------+  Right 1.12       0.94                                  +-------+-----------+-----------+------------+------------+  Left  1.11       0.69                                 +-------+-----------+-----------+------------+------------+    Rande Brunt. Lenell Antu, MD [facility] Phone Number: [phone] 09/23/2022 4:33 PM   Total time spent on preparing this encounter including chart review, data review, collecting history, examining the patient, coordinating care for this new patient, 45 minutes.  Portions of this report may have been transcribed using voice recognition software.  Every effort has been made to ensure accuracy; however, inadvertent computerized transcription errors may still be present.

## 2022-10-16 ENCOUNTER — Ambulatory Visit: Payer: Federal, State, Local not specified - PPO | Admitting: "Endocrinology

## 2022-11-03 DIAGNOSIS — E162 Hypoglycemia, unspecified: Secondary | ICD-10-CM | POA: Diagnosis not present

## 2023-01-20 ENCOUNTER — Encounter (HOSPITAL_COMMUNITY): Payer: Self-pay

## 2023-01-20 ENCOUNTER — Emergency Department (HOSPITAL_COMMUNITY): Payer: Federal, State, Local not specified - PPO

## 2023-01-20 ENCOUNTER — Other Ambulatory Visit: Payer: Self-pay

## 2023-01-20 ENCOUNTER — Emergency Department (HOSPITAL_COMMUNITY)
Admission: EM | Admit: 2023-01-20 | Discharge: 2023-01-21 | Disposition: A | Discharge status: Home / Self Care | Payer: Federal, State, Local not specified - PPO | Attending: Emergency Medicine | Admitting: Emergency Medicine

## 2023-01-20 DIAGNOSIS — R519 Headache, unspecified: Secondary | ICD-10-CM | POA: Insufficient documentation

## 2023-01-20 DIAGNOSIS — M542 Cervicalgia: Secondary | ICD-10-CM | POA: Diagnosis not present

## 2023-01-20 DIAGNOSIS — J45909 Unspecified asthma, uncomplicated: Secondary | ICD-10-CM | POA: Insufficient documentation

## 2023-01-20 DIAGNOSIS — H538 Other visual disturbances: Secondary | ICD-10-CM | POA: Diagnosis not present

## 2023-01-20 DIAGNOSIS — J019 Acute sinusitis, unspecified: Secondary | ICD-10-CM

## 2023-01-20 LAB — CBG MONITORING, ED: Glucose-Capillary: 88 mg/dL (ref 70–99)

## 2023-01-20 NOTE — ED Provider Notes (Signed)
[place] [facility] AT [facility] Provider Note   CSN: [id number] Arrival date & time: 01/20/23  1828     History  Chief Complaint  Patient presents with   Headache    Emily Johns is a 52 y.o. female.  Patient is a 52 year old female with past medical history of asthma, GERD, seasonal allergies.  Patient presenting today for evaluation of headache.  Patient currently being treated for a sinus infection.  This afternoon, she developed a sharp pain to the left scalp just above her left ear.  This was a stabbing pain and was persistent.  She then went to urgent care for evaluation who recommended she come to the ER for a CT scan.  Patient's headache has since markedly improved and is almost completely gone.  She denies any visual disturbances, nausea, vomiting, weakness, or numbness.  The history is provided by the patient.       Home Medications Prior to Admission medications   Medication Sig Start Date End Date Taking? Authorizing Provider  albuterol (VENTOLIN HFA) 108 (90 Base) MCG/ACT inhaler Inhale 1-2 puffs into the lungs every 6 (six) hours as needed for wheezing or shortness of breath. 03/17/16   [provider]  Cholecalciferol (VITAMIN D3 PO) Take 1 tablet by mouth daily.    [provider]  Continuous Blood Gluc Sensor (FREESTYLE LIBRE 3 SENSOR) MISC PLACE 1 SENSOR ON SKIN EVERY 14 DAYS TO CHECK GLUCOSE CONTINUOUSLY 04/17/22   Roma Kayser, MD  fluticasone (FLONASE) 50 MCG/ACT nasal spray Place 2 sprays into both nostrils daily as needed (allergies.). 08/19/19   [provider]  ibuprofen (ADVIL,MOTRIN) 200 MG tablet Take 600 mg by mouth every 8 (eight) hours as needed (for pain.).    [provider]  pantoprazole (PROTONIX) 40 MG tablet Take 1 tablet (40 mg total) by mouth daily. 01/19/20   Pyrtle, Carie Caddy, MD      Allergies    Other, Penicillins, Shellfish allergy, Iodinated contrast media, Levaquin  [levofloxacin in d5w], and Lodine [etodolac]    Review of Systems   Review of Systems  All other systems reviewed and are negative.   Physical Exam Updated Vital Signs BP (!) 139/95 (BP Location: Right Arm)   Pulse 77   Temp 97.8 F (36.6 C)   Resp 20   Ht 5\' 4"  (1.626 m)   Wt 86.2 kg   SpO2 100%   BMI 32.61 kg/m  Physical Exam Vitals and nursing note reviewed.  Constitutional:      General: She is not in acute distress.    Appearance: She is well-developed. She is not diaphoretic.  HENT:     Head: Normocephalic and atraumatic.  Eyes:     Extraocular Movements: Extraocular movements intact.     Pupils: Pupils are equal, round, and reactive to light.  Cardiovascular:     Rate and Rhythm: Normal rate and regular rhythm.     Heart sounds: No murmur heard.    No friction rub. No gallop.  Pulmonary:     Effort: Pulmonary effort is normal. No respiratory distress.     Breath sounds: Normal breath sounds. No wheezing.  Abdominal:     General: Bowel sounds are normal. There is no distension.     Palpations: Abdomen is soft.     Tenderness: There is no abdominal tenderness.  Musculoskeletal:        General: Normal range of motion.     Cervical back: Normal range of  motion and neck supple.  Skin:    General: Skin is warm and dry.  Neurological:     General: No focal deficit present.     Mental Status: She is alert and oriented to person, place, and time.     Cranial Nerves: No cranial nerve deficit, dysarthria or facial asymmetry.     Motor: No weakness.     ED Results / Procedures / Treatments   Labs (all labs ordered are listed, but only abnormal results are displayed) Labs Reviewed  CBG MONITORING, ED    EKG None  Radiology No results found.  Procedures Procedures  {Document cardiac monitor, telemetry assessment procedure when appropriate:1}  Medications Ordered in ED Medications - No data to display  ED Course/ Medical Decision Making/ A&P   {    Click here for ABCD2, HEART and other calculatorsREFRESH Note before signing :1}                              Medical Decision Making Amount and/or Complexity of Data Reviewed Radiology: ordered.   ***  {Document critical care time when appropriate:1} {Document review of labs and clinical decision tools ie heart score, Chads2Vasc2 etc:1}  {Document your independent review of radiology images, and any outside records:1} {Document your discussion with family members, caretakers, and with consultants:1} {Document social determinants of health affecting pt's care:1} {Document your decision making why or why not admission, treatments were needed:1} Final Clinical Impression(s) / ED Diagnoses Final diagnoses:  None    Rx / DC Orders ED Discharge Orders     None

## 2023-01-20 NOTE — ED Triage Notes (Signed)
Pt states she knows she has a sinus infection but today she had neck tenderness/pain & really sharp, fast pain on the left side of her head above her ear.  States she has never had a pain like that before. Went to urgent care & they told her she needed to be seen for CT.

## 2023-01-21 MED ORDER — AZITHROMYCIN 250 MG PO TABS
500.0000 mg | ORAL_TABLET | Freq: Once | ORAL | Status: AC
  Administered 2023-01-21: 500 mg via ORAL
  Filled 2023-01-21: qty 2

## 2023-01-21 MED ORDER — AZITHROMYCIN 250 MG PO TABS: 250.0000 mg | ORAL_TABLET | Freq: Every day | ORAL | 0 refills | Status: AC

## 2023-01-21 NOTE — Discharge Instructions (Addendum)
Begin taking Zithromax as prescribed.  Take ibuprofen 600 mg every 6 hours as needed for pain.  Follow-up with primary doctor if not improving in the next few days.

## 2023-05-14 DIAGNOSIS — E6689 Other obesity not elsewhere classified: Secondary | ICD-10-CM | POA: Diagnosis not present

## 2023-05-14 DIAGNOSIS — E162 Hypoglycemia, unspecified: Secondary | ICD-10-CM | POA: Diagnosis not present

## 2023-05-14 DIAGNOSIS — E66811 Obesity, class 1: Secondary | ICD-10-CM | POA: Diagnosis not present

## 2023-06-24 DIAGNOSIS — Z1231 Encounter for screening mammogram for malignant neoplasm of breast: Secondary | ICD-10-CM | POA: Diagnosis not present

## 2023-10-05 DIAGNOSIS — M79671 Pain in right foot: Secondary | ICD-10-CM | POA: Diagnosis not present

## 2023-12-03 DIAGNOSIS — E162 Hypoglycemia, unspecified: Secondary | ICD-10-CM | POA: Diagnosis not present

## 2023-12-07 DIAGNOSIS — L03116 Cellulitis of left lower limb: Secondary | ICD-10-CM | POA: Diagnosis not present

## 2023-12-07 DIAGNOSIS — J40 Bronchitis, not specified as acute or chronic: Secondary | ICD-10-CM | POA: Diagnosis not present

## 2023-12-07 DIAGNOSIS — J0100 Acute maxillary sinusitis, unspecified: Secondary | ICD-10-CM | POA: Diagnosis not present

## 2023-12-07 DIAGNOSIS — R350 Frequency of micturition: Secondary | ICD-10-CM | POA: Diagnosis not present

## 2023-12-10 DIAGNOSIS — E162 Hypoglycemia, unspecified: Secondary | ICD-10-CM | POA: Diagnosis not present
